# Patient Record
Sex: Female | Born: 1965 | Race: White | Hispanic: No | Smoking: Never smoker
Health system: Southern US, Community
[De-identification: ages and names within clinical notes are randomized; demographics above are authoritative.]

## PROBLEM LIST (undated history)

## (undated) DIAGNOSIS — R7303 Prediabetes: Secondary | ICD-10-CM

## (undated) DIAGNOSIS — R600 Localized edema: Secondary | ICD-10-CM

## (undated) DIAGNOSIS — C801 Malignant (primary) neoplasm, unspecified: Secondary | ICD-10-CM

## (undated) DIAGNOSIS — G8929 Other chronic pain: Secondary | ICD-10-CM

## (undated) DIAGNOSIS — R002 Palpitations: Secondary | ICD-10-CM

## (undated) DIAGNOSIS — F32A Depression, unspecified: Secondary | ICD-10-CM

## (undated) DIAGNOSIS — F419 Anxiety disorder, unspecified: Secondary | ICD-10-CM

## (undated) DIAGNOSIS — E282 Polycystic ovarian syndrome: Secondary | ICD-10-CM

## (undated) DIAGNOSIS — R0602 Shortness of breath: Secondary | ICD-10-CM

## (undated) DIAGNOSIS — I4891 Unspecified atrial fibrillation: Secondary | ICD-10-CM

## (undated) DIAGNOSIS — I10 Essential (primary) hypertension: Secondary | ICD-10-CM

## (undated) DIAGNOSIS — R519 Headache, unspecified: Secondary | ICD-10-CM

## (undated) DIAGNOSIS — F329 Major depressive disorder, single episode, unspecified: Secondary | ICD-10-CM

## (undated) DIAGNOSIS — R51 Headache: Secondary | ICD-10-CM

## (undated) HISTORY — DX: Polycystic ovarian syndrome: E28.2

## (undated) HISTORY — DX: Malignant (primary) neoplasm, unspecified: C80.1

## (undated) HISTORY — DX: Localized edema: R60.0

## (undated) HISTORY — DX: Prediabetes: R73.03

## (undated) HISTORY — DX: Depression, unspecified: F32.A

## (undated) HISTORY — PX: OTHER SURGICAL HISTORY: SHX169

## (undated) HISTORY — DX: Major depressive disorder, single episode, unspecified: F32.9

## (undated) HISTORY — DX: Shortness of breath: R06.02

## (undated) HISTORY — DX: Anxiety disorder, unspecified: F41.9

## (undated) HISTORY — PX: SKIN CANCER EXCISION: SHX779

## (undated) HISTORY — PX: WISDOM TOOTH EXTRACTION: SHX21

---
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
## 1999-08-10 ENCOUNTER — Other Ambulatory Visit: Admission: RE | Admit: 1999-08-10 | Discharge: 1999-08-10 | Payer: Self-pay | Admitting: Obstetrics & Gynecology

## 2000-08-25 ENCOUNTER — Other Ambulatory Visit: Admission: RE | Admit: 2000-08-25 | Discharge: 2000-08-25 | Payer: Self-pay | Admitting: Obstetrics & Gynecology

## 2002-04-24 ENCOUNTER — Emergency Department (HOSPITAL_COMMUNITY): Admission: EM | Admit: 2002-04-24 | Discharge: 2002-04-24 | Payer: Self-pay

## 2002-09-06 ENCOUNTER — Encounter: Payer: Self-pay | Admitting: Emergency Medicine

## 2002-09-06 ENCOUNTER — Emergency Department (HOSPITAL_COMMUNITY): Admission: EM | Admit: 2002-09-06 | Discharge: 2002-09-06 | Payer: Self-pay | Admitting: Emergency Medicine

## 2002-09-10 ENCOUNTER — Ambulatory Visit (HOSPITAL_COMMUNITY): Admission: RE | Admit: 2002-09-10 | Discharge: 2002-09-10 | Payer: Self-pay | Admitting: Orthopedic Surgery

## 2002-09-10 ENCOUNTER — Encounter: Payer: Self-pay | Admitting: Orthopedic Surgery

## 2003-01-27 ENCOUNTER — Other Ambulatory Visit: Admission: RE | Admit: 2003-01-27 | Discharge: 2003-01-27 | Payer: Self-pay | Admitting: Obstetrics & Gynecology

## 2003-08-25 ENCOUNTER — Encounter (INDEPENDENT_AMBULATORY_CARE_PROVIDER_SITE_OTHER): Payer: Self-pay | Admitting: *Deleted

## 2003-08-25 ENCOUNTER — Inpatient Hospital Stay (HOSPITAL_COMMUNITY): Admission: RE | Admit: 2003-08-25 | Discharge: 2003-08-29 | Payer: Self-pay | Admitting: Obstetrics & Gynecology

## 2003-08-30 ENCOUNTER — Encounter: Admission: RE | Admit: 2003-08-30 | Discharge: 2003-09-29 | Payer: Self-pay | Admitting: Obstetrics & Gynecology

## 2003-09-26 ENCOUNTER — Other Ambulatory Visit: Admission: RE | Admit: 2003-09-26 | Discharge: 2003-09-26 | Payer: Self-pay | Admitting: Obstetrics & Gynecology

## 2003-10-04 ENCOUNTER — Ambulatory Visit (HOSPITAL_COMMUNITY): Admission: RE | Admit: 2003-10-04 | Discharge: 2003-10-04 | Payer: Self-pay | Admitting: Obstetrics & Gynecology

## 2004-09-27 ENCOUNTER — Encounter: Admission: RE | Admit: 2004-09-27 | Discharge: 2004-09-27 | Payer: Self-pay | Admitting: Obstetrics and Gynecology

## 2005-03-05 ENCOUNTER — Ambulatory Visit: Payer: Self-pay | Admitting: Internal Medicine

## 2006-02-25 ENCOUNTER — Ambulatory Visit: Payer: Self-pay | Admitting: Internal Medicine

## 2006-03-11 ENCOUNTER — Ambulatory Visit: Payer: Self-pay | Admitting: Internal Medicine

## 2007-05-19 ENCOUNTER — Encounter: Admission: RE | Admit: 2007-05-19 | Discharge: 2007-05-19 | Payer: Self-pay | Admitting: Obstetrics & Gynecology

## 2007-07-14 ENCOUNTER — Encounter (INDEPENDENT_AMBULATORY_CARE_PROVIDER_SITE_OTHER): Payer: Self-pay | Admitting: Obstetrics & Gynecology

## 2007-07-14 ENCOUNTER — Inpatient Hospital Stay (HOSPITAL_COMMUNITY): Admission: RE | Admit: 2007-07-14 | Discharge: 2007-07-17 | Payer: Self-pay | Admitting: Obstetrics & Gynecology

## 2007-07-20 ENCOUNTER — Encounter: Admission: RE | Admit: 2007-07-20 | Discharge: 2007-08-19 | Payer: Self-pay | Admitting: Obstetrics & Gynecology

## 2007-08-20 ENCOUNTER — Encounter: Admission: RE | Admit: 2007-08-20 | Discharge: 2007-09-18 | Payer: Self-pay | Admitting: Obstetrics & Gynecology

## 2007-09-19 ENCOUNTER — Encounter: Admission: RE | Admit: 2007-09-19 | Discharge: 2007-09-21 | Payer: Self-pay | Admitting: Obstetrics & Gynecology

## 2009-04-06 ENCOUNTER — Ambulatory Visit: Payer: Self-pay | Admitting: Internal Medicine

## 2009-04-06 LAB — CONVERTED CEMR LAB
Bilirubin Urine: NEGATIVE
Glucose, Urine, Semiquant: NEGATIVE
Ketones, urine, test strip: NEGATIVE
Nitrite: NEGATIVE
Protein, U semiquant: NEGATIVE
Specific Gravity, Urine: 1.015
Urobilinogen, UA: 0.2
pH: 6.5

## 2009-04-07 ENCOUNTER — Encounter: Payer: Self-pay | Admitting: Internal Medicine

## 2009-04-10 ENCOUNTER — Telehealth (INDEPENDENT_AMBULATORY_CARE_PROVIDER_SITE_OTHER): Payer: Self-pay | Admitting: *Deleted

## 2009-04-11 ENCOUNTER — Encounter (INDEPENDENT_AMBULATORY_CARE_PROVIDER_SITE_OTHER): Payer: Self-pay | Admitting: *Deleted

## 2009-04-11 ENCOUNTER — Telehealth (INDEPENDENT_AMBULATORY_CARE_PROVIDER_SITE_OTHER): Payer: Self-pay | Admitting: *Deleted

## 2009-05-22 ENCOUNTER — Ambulatory Visit: Payer: Self-pay | Admitting: Family Medicine

## 2009-05-22 LAB — CONVERTED CEMR LAB
Bilirubin Urine: NEGATIVE
Glucose, Urine, Semiquant: NEGATIVE
Ketones, urine, test strip: NEGATIVE
Protein, U semiquant: NEGATIVE
Specific Gravity, Urine: 1.01
Urobilinogen, UA: 0.2

## 2009-06-14 ENCOUNTER — Ambulatory Visit: Payer: Self-pay | Admitting: Internal Medicine

## 2009-06-17 LAB — CONVERTED CEMR LAB
Free T4: 0.9 ng/dL (ref 0.6–1.6)
TSH: 0.99 microintl units/mL (ref 0.35–5.50)

## 2009-06-19 ENCOUNTER — Encounter (INDEPENDENT_AMBULATORY_CARE_PROVIDER_SITE_OTHER): Payer: Self-pay | Admitting: *Deleted

## 2009-07-07 ENCOUNTER — Ambulatory Visit: Payer: Self-pay | Admitting: Family Medicine

## 2009-07-07 LAB — CONVERTED CEMR LAB
Glucose, Urine, Semiquant: NEGATIVE
Ketones, urine, test strip: NEGATIVE
Nitrite: NEGATIVE
WBC Urine, dipstick: NEGATIVE
pH: 6

## 2009-07-08 ENCOUNTER — Ambulatory Visit: Payer: Self-pay | Admitting: Family Medicine

## 2009-07-10 ENCOUNTER — Encounter: Payer: Self-pay | Admitting: Family Medicine

## 2009-07-11 LAB — CONVERTED CEMR LAB
Chlamydia, Swab/Urine, PCR: NEGATIVE
GC Probe Amp, Urine: NEGATIVE

## 2009-07-18 ENCOUNTER — Telehealth: Payer: Self-pay | Admitting: Family Medicine

## 2009-10-05 ENCOUNTER — Ambulatory Visit: Payer: Self-pay | Admitting: Internal Medicine

## 2009-11-07 ENCOUNTER — Telehealth (INDEPENDENT_AMBULATORY_CARE_PROVIDER_SITE_OTHER): Payer: Self-pay | Admitting: *Deleted

## 2009-11-07 ENCOUNTER — Emergency Department (HOSPITAL_COMMUNITY): Admission: EM | Admit: 2009-11-07 | Discharge: 2009-11-07 | Payer: Self-pay | Admitting: Emergency Medicine

## 2009-12-02 ENCOUNTER — Emergency Department (HOSPITAL_COMMUNITY): Admission: EM | Admit: 2009-12-02 | Discharge: 2009-12-02 | Payer: Self-pay | Admitting: Emergency Medicine

## 2010-02-15 ENCOUNTER — Encounter (INDEPENDENT_AMBULATORY_CARE_PROVIDER_SITE_OTHER): Payer: Self-pay | Admitting: *Deleted

## 2010-02-15 ENCOUNTER — Ambulatory Visit: Payer: Self-pay | Admitting: Internal Medicine

## 2010-07-30 ENCOUNTER — Ambulatory Visit: Payer: Self-pay | Admitting: Internal Medicine

## 2010-08-04 ENCOUNTER — Encounter: Payer: Self-pay | Admitting: Internal Medicine

## 2010-08-07 ENCOUNTER — Telehealth: Payer: Self-pay | Admitting: Internal Medicine

## 2010-08-15 LAB — CONVERTED CEMR LAB

## 2011-01-03 NOTE — Letter (Signed)
Summary: Work Dietitian at [employer]  [address] [place], [place] [address]   Phone: [phone]  Fax: [phone]    Today's Date: February 15, 2010  Name of Patient: Jodi Bailey  The above named patient had a medical visit today at:  10:10am / pm.  Please take this into consideration when reviewing the time away from work/school.    Special Instructions:  [  ] None  [X]  To be off the remainder of today, returning to the normal work / school schedule tomorrow.  [  ] To be off until the next scheduled appointment on ______________________.  [  ] Other ________________________________________________________________ ________________________________________________________________________   Sincerely yours,   Shonna Chock

## 2011-01-03 NOTE — Assessment & Plan Note (Signed)
Summary: STREP THROAT/KDC   Vital Signs:  Patient profile:   45 year old female Weight:      246.8 pounds Temp:     99.3 degrees F oral Pulse rate:   64 / minute Resp:     15 per minute BP sitting:   130 / 74  (left arm) Cuff size:   large  Vitals Entered By: Shonna Chock (February 15, 2010 10:38 AM) CC: Sore throat and eyes itchy/draining in the am. Patient would like to be checked for MRSA. Patient with 2 children at home with strep. 1 of patient's son had double ear infection,strep,MRSA, Pink eye. Comments REVIEWED MED LIST, PATIENT AGREED DOSE AND INSTRUCTION CORRECT    CC:  Sore throat and eyes itchy/draining in the am. Patient would like to be checked for MRSA. Patient with 2 children at home with strep. 1 of patient's son had double ear infection, strep, MRSA, and Pink eye.Marland Kitchen  History of Present Illness: Onset last night as "tearing"  ST with frontal headache & myalgias. Rx: Tylenol for temp to 101.5 this am. Son @ [facility]  ER with double otitis & facial MRSA. Flu shot 09/2009.  Allergies (verified): No Known Drug Allergies  Review of Systems General:  Complains of fever; denies chills and sweats. Eyes:  Complains of discharge and red eye; denies eye pain and vision loss-both eyes; Some yellow from eyes. ENT:  Denies earache, nasal congestion, and sinus pressure; No facial pain  or purulence. Resp:  Complains of cough; denies sputum productive.  Physical Exam  General:  well-nourished,in no acute distress; alert,appropriate and cooperative throughout examination Eyes:  No corneal or conjunctival inflammation noted. EOMI. Perrla.  Vision grossly normal. Ears:  External ear exam shows no significant lesions or deformities.  Otoscopic examination reveals clear canals, tympanic membranes are intact bilaterally without bulging, retraction, inflammation or discharge. Hearing is grossly normal bilaterally. Nose:  External nasal examination shows no deformity or inflammation. Nasal  mucosa are erythematous, esp R nare without lesions or exudates. Mouth:  Oral mucosa and oropharynx without lesions or exudates.  Teeth in good repair. Mild pharyngeal erythema with aphthous ulcer.   Lungs:  Normal respiratory effort, chest expands symmetrically. Lungs are clear to auscultation, no crackles or wheezes. Heart:  Normal rate and regular rhythm. S1 and S2 normal without gallop, murmur, click, rub or other extra sounds. Cervical Nodes:  No lymphadenopathy noted Axillary Nodes:  No palpable lymphadenopathy   Impression & Recommendations:  Problem # 1:  PHARYNGITIS-ACUTE (ICD-462) Aphthous ulcer present Her updated medication list for this problem includes:    Aspirin 81 Mg Tabs (Aspirin) .Marland Kitchen... 1 by mouth once daily    Doxycycline Hyclate 100 Mg Caps (Doxycycline hyclate) .Marland Kitchen... 1 two times a day ; avoid sun  Problem # 2:  CONJUNCTIVITIS (ICD-372.30) Puruence not present clinically  Problem # 3:  FEVER (ICD-780.60)  Complete Medication List: 1)  Aspirin 81 Mg Tabs (Aspirin) .Marland Kitchen.. 1 by mouth once daily 2)  Doxycycline Hyclate 100 Mg Caps (Doxycycline hyclate) .Marland Kitchen.. 1 two times a day ; avoid sun  Other Orders: Rapid Strep ([address])  Patient Instructions: 1)  Report signs of bacterial conjunctivitis as discussed; Natural Tears as needed. Zicam or Zinc lozenges for ST. 2)  Drink as much fluid as you can tolerate for the next few days. Bactroban to tender nasal tissue once daily  3)  Take 650-1000mg  of Tylenol every 4-6 hours as needed for relief of pain or comfort of fever AVOID taking more  than 4000mg   in a 24 hour period (can cause liver damage in higher doses) OR take 400-600mg  of Ibuprofen (Advil, Motrin) with food every 4-6 hours as needed for relief of pain or comfort of fever. Prescriptions: DOXYCYCLINE HYCLATE 100 MG CAPS (DOXYCYCLINE HYCLATE) 1 two times a day ; avoid sun  #20 x 0   Entered and Authorized by:   Marga Melnick MD   Signed by:   Marga Melnick MD on  02/15/2010   Method used:   Faxed to ...       [facility] [address] (retail)       [address]       [place], [place]  [address]       Ph: [phone] or [phone]       Fax: [phone]   RxID:   [id number]

## 2011-01-03 NOTE — Assessment & Plan Note (Signed)
Summary: POSSIBLE MRSA/RH......Marland Kitchen   Vital Signs:  Patient profile:   45 year old female Weight:      231.25 pounds Temp:     99.6 degrees F oral Pulse rate:   85 / minute Pulse rhythm:   regular BP sitting:   126 / 82  (left arm) Cuff size:   large  Vitals Entered By: Army Fossa CMA (July 30, 2010 4:31 PM) CC: Pt here for possible MRSA in vaginal area.    History of Present Illness: husband w/  documented MRSA (boils in the legs) dx last week she presents w/ a boil in the genital area x 3 days  she saw some d/c from the area, still hurting  ROS no fever also complaining of vaginal irritation and some vaginal discharge, pink color. yeast infection?  Allergies (verified): No Known Drug Allergies  Past History:  Past Medical History: denies major problems   Past Surgical History: c-section  Social History: Reviewed history from 05/22/2009 and no changes required. married  Physical Exam  General:  alert and well-developed.   Abdomen:  soft, non-tender, no distention, and no masses.   Genitalia:  she has a tender non-fluctuant .mass --0.5 cm-- distal and right from the clitoris vaginal introitus is a slightly red, some discharge Bimanual exam showed no mass    Impression & Recommendations:  Problem # 1:  CARBUNCLE AND FURUNCLE OF UNSPECIFIED SITE (ICD-680.9) Vulvar boil, likely MRSA given the location of the boil, doubt this is a Bartholin's cyst. Unfortunately there is nothing to be  cultured  at this point, unable to get any additional pus plan:  Doxycycline she would let me know immediately if worse, will send to gynecology (UPT: neg)  Problem # 2:  VAGINITIS (ICD-616.10)  wet prep sent Monistat over-the-counter addendum-- request diflucan, will RX but  still rec monistat The following medications were removed from the medication list:    Doxycycline Hyclate 100 Mg Caps (Doxycycline hyclate) .Marland Kitchen... 1 two times a day ; avoid sun Her updated  medication list for this problem includes:    Vibramycin 100 Mg Caps (Doxycycline hyclate) ..... One by mouth twice a day  Orders: Urine Pregnancy Test  ([address]) T- * Misc. Laboratory test [phone])  Complete Medication List: 1)  Vibramycin 100 Mg Caps (Doxycycline hyclate) .... One by mouth twice a day 2)  Diflucan 150 Mg Tabs (Fluconazole) .Marland Kitchen.. 1 by mouth once daily  Patient Instructions: 1)  take medications as prescribed 2)  Avoid excessive sun exposure 3)  Monistat over-the-counter for one week 4)  Call anytime if you are not improving Prescriptions: DIFLUCAN 150 MG TABS (FLUCONAZOLE) 1 by mouth once daily  #2 x 0   Entered and Authorized by:   Nolon Rod. Paz MD   Signed by:   Nolon Rod. Paz MD on 07/30/2010   Method used:   Electronically to        [facility] [phone][facility] (retail)       [address]       [place], [place]  [address]       Ph: [phone] or [phone]       Fax: [phone]   RxID:   [id number] VIBRAMYCIN 100 MG CAPS (DOXYCYCLINE HYCLATE) one by mouth twice a day  #14 x 0   Entered and Authorized by:   Nolon Rod. Paz MD   Signed by:   Nolon Rod. Paz MD on 07/30/2010   Method used:   Electronically to  [facility] [address] (retail)       [address]       [place], [place]  [address]       Ph: [phone] or [phone]       Fax: [phone]   RxID:   [id number]   Laboratory Results   Urine Tests      Urine HCG: negative

## 2011-01-03 NOTE — Progress Notes (Signed)
Summary: Pt status   ---- Converted from flag ---- ---- 07/31/2010 8:44 AM, Jose E. Paz MD wrote: please check on the patient, improving ? ------------------------------  Left message for pt to call back. Army Fossa CMA  August 07, 2010 1:09 PM  Left message for pt to call back. Army Fossa CMA  August 08, 2010 10:41 AM  I spoke with pt she states she is doing better. She has noticied a red spot on upper thigh but is thinking that it may just be a mosquito bite. She will come in if it gets worse. Army Fossa CMA  August 09, 2010 8:39 AM

## 2011-02-04 ENCOUNTER — Ambulatory Visit (INDEPENDENT_AMBULATORY_CARE_PROVIDER_SITE_OTHER): Payer: Self-pay | Admitting: Internal Medicine

## 2011-02-04 ENCOUNTER — Encounter: Payer: Self-pay | Admitting: Internal Medicine

## 2011-02-04 DIAGNOSIS — M549 Dorsalgia, unspecified: Secondary | ICD-10-CM | POA: Insufficient documentation

## 2011-02-12 NOTE — Assessment & Plan Note (Signed)
Summary: leaned over and back locked up, doesnt have back problems///sph   Vital Signs:  Patient profile:   45 year old female Weight:      240.13 pounds Pulse rate:   88 / minute Pulse rhythm:   regular BP sitting:   132 / 86  (left arm) Cuff size:   large  Vitals Entered By: Army Fossa CMA (February 04, 2011 3:44 PM) CC: Bending over yesterday and felt her back lock Comments haivng lower back pain using advil [facility]   History of Present Illness:  developed  severe back pain yesterday at 7 AM when she was bending to pick up something from the floor.  she  felt that her back " locked up"  , pain is at the bilateral paraspinal muscles around T12  to L2.  since then she's been using a  heating pad and Advil and the pain is slightly better  Review of systems No fevers No bladder or bowel incontinence No lower tourniquet paresthesias No radiation to the lower extremities She is allergic to codeine but she can tolerate hydrocodone if needed  Current Medications (verified): 1)  None  Allergies (verified): 1)  ! Codeine  Past History:  Past Medical History: Reviewed history from 07/30/2010 and no changes required. denies major problems   Past Surgical History: Reviewed history from 07/30/2010 and no changes required. c-section  Physical Exam  General:  alert and well-developed.   Msk:   antalgic gait, she is walking with her back slightly  flexed.  Slightly tender at  the paraspinal muscles at the midback, not tender over the spine Extremities:   no lower extremity edema Neurologic:  alert & oriented X3 and DTRs symmetrical ,  slightly decreased ankle jerk bilaterally. Strength symmetric,  straight leg test negative   Impression & Recommendations:  Problem # 1:  BACK PAIN (ICD-724.5) acute back pain, conservative treatment x few days  Her updated medication list for this problem includes:    Vicodin 5-500 Mg Tabs (Hydrocodone-acetaminophen) .Marland Kitchen... 1 or 2 by  mouth every 6 hours as needed pain    Flexeril 10 Mg Tabs (Cyclobenzaprine hcl) .Marland Kitchen... 1 by mouth three times a day as needed pain  Complete Medication List: 1)  Vicodin 5-500 Mg Tabs (Hydrocodone-acetaminophen) .Marland Kitchen.. 1 or 2 by mouth every 6 hours as needed pain 2)  Flexeril 10 Mg Tabs (Cyclobenzaprine hcl) .Marland Kitchen.. 1 by mouth three times a day as needed pain  Patient Instructions: 1)  rest, warm compresses 2)   ibuprofen 200 mg 3 tablets every 8 hours as needed for pain. Watch for stomach side effects ,  nausea, burning 3)   Flexeril as needed for muscle spasm , will make you drowsy 4)  Vicodin if pain continues, will make  you  drowsy 5)  Call if no better in 10 days Prescriptions: FLEXERIL 10 MG TABS (CYCLOBENZAPRINE HCL) 1 by mouth three times a day as needed pain  #21 x 0   Entered and Authorized by:   Nolon Rod. Ivyonna Hoelzel MD   Signed by:   Nolon Rod. Callahan Wild MD on 02/04/2011   Method used:   Print then Give to Patient   RxID:   [id number] VICODIN 5-500 MG TABS (HYDROCODONE-ACETAMINOPHEN) 1 or 2 by mouth every 6 hours as needed pain  #30 x 0   Entered and Authorized by:   Nolon Rod. Wendelin Bradt MD   Signed by:   Nolon Rod. Zakara Parkey MD on 02/04/2011   Method used:  Print then Give to Patient   RxID:   [id number]    Orders Added: 1)  Est. Patient Level III [[address]]

## 2011-02-18 ENCOUNTER — Encounter: Payer: Self-pay | Admitting: Internal Medicine

## 2011-02-18 ENCOUNTER — Ambulatory Visit (INDEPENDENT_AMBULATORY_CARE_PROVIDER_SITE_OTHER): Payer: BC Managed Care – PPO | Admitting: Internal Medicine

## 2011-02-18 ENCOUNTER — Other Ambulatory Visit: Payer: Self-pay | Admitting: Internal Medicine

## 2011-02-18 DIAGNOSIS — N390 Urinary tract infection, site not specified: Secondary | ICD-10-CM

## 2011-02-18 DIAGNOSIS — M545 Low back pain, unspecified: Secondary | ICD-10-CM | POA: Insufficient documentation

## 2011-02-18 DIAGNOSIS — Z85828 Personal history of other malignant neoplasm of skin: Secondary | ICD-10-CM | POA: Insufficient documentation

## 2011-02-18 DIAGNOSIS — R319 Hematuria, unspecified: Secondary | ICD-10-CM

## 2011-02-18 LAB — CONVERTED CEMR LAB
Bilirubin Urine: NEGATIVE
Glucose, Urine, Semiquant: NEGATIVE
Ketones, urine, test strip: NEGATIVE
Nitrite: NEGATIVE
Specific Gravity, Urine: <1.005

## 2011-02-20 ENCOUNTER — Other Ambulatory Visit: Payer: Self-pay | Admitting: Internal Medicine

## 2011-02-20 DIAGNOSIS — N390 Urinary tract infection, site not specified: Secondary | ICD-10-CM

## 2011-02-20 LAB — URINE CULTURE: Colony Count: >=100000

## 2011-02-20 MED ORDER — NITROFURANTOIN MONOHYD MACRO 100 MG PO CAPS: 100.0000 mg | ORAL_CAPSULE | Freq: Two times a day (BID) | ORAL | Status: DC

## 2011-02-21 ENCOUNTER — Telehealth: Payer: Self-pay

## 2011-02-21 ENCOUNTER — Other Ambulatory Visit: Payer: Self-pay | Admitting: Internal Medicine

## 2011-02-21 DIAGNOSIS — T887XXA Unspecified adverse effect of drug or medicament, initial encounter: Secondary | ICD-10-CM

## 2011-02-21 DIAGNOSIS — N390 Urinary tract infection, site not specified: Secondary | ICD-10-CM

## 2011-02-21 MED ORDER — FLUCONAZOLE 150 MG PO TABS: 150.0000 mg | ORAL_TABLET | Freq: Once | ORAL | Status: AC

## 2011-02-21 MED ORDER — NITROFURANTOIN MONOHYD MACRO 100 MG PO CAPS: 100.0000 mg | ORAL_CAPSULE | Freq: Two times a day (BID) | ORAL | Status: AC

## 2011-02-21 NOTE — Telephone Encounter (Signed)
Macrodantin should eradicate the infection and is a better initial choice than Cipro. Cipro should be reserved for more serious infections. The Macrodantin should not cause vaginitis. I did call in Diflucan 150 #1 just in case. Taking the probiotic Align should help prevent vaginitis or other antibiotic associated issues.

## 2011-02-21 NOTE — Telephone Encounter (Signed)
Spoke with patient and she indicated she is currently taking Cipro and will change to the recommended antibiotic. Patient states she usually gets a yeast infection with antibiotics and requested rx for Diflucan to be sent in as well # 2. Patient informed I will have to get that approved by MD and then I will [facility]. Patient aware if MD opposes prescribing Diflucan I will call to inform her

## 2011-02-28 NOTE — Assessment & Plan Note (Signed)
Summary: temp,blood in urine/cbs   Vital Signs:  Patient profile:   45 year old female LMP:     02/10/2011 Weight:      238.2 pounds Temp:     98.4 degrees F oral Pulse rate:   80 / minute Resp:     12 per minute BP sitting:   122 / 78  (left arm) Cuff size:   large  Vitals Entered By: Shonna Chock CMA (February 18, 2011 11:50 AM) CC: 1.) Temp since Saturday and blood in urine since last night   2.) Ongoing back pain , Dysuria LMP (date): 02/10/2011     Enter LMP: 02/10/2011   CC:  1.) Temp since Saturday and blood in urine since last night   2.) Ongoing back pain  and Dysuria.  History of Present Illness:    Onset 02/16/2011 as lightheadedness, nausea & dizziness. As of 03/18 she  reports burning with urination, urinary frequency, urgency, and hematuria, but denies vaginal discharge.  Associated symptoms include fever, shaking chills, R  flank pain, back pain ( onset 03/04 post strain; on NASIDS up to 6-8 /day since 03/04), and pelvic pain as RLQ/ inguinal area pain.  The patient denies the following risk factors: diabetes, history of GU anomaly, and history of pyelonephritis.  History is significant for > 3 UTIs in past year , all 6 episodes  treated @ [facility]. No C&S done. Her mother & sister have PMH of recurrent UTIs. PMH of PCOS.  Allergies: 1)  ! Codeine  Past History:  Past Medical History: Skin cancer, PMH  of, Melanoma R thigh age 52  PCOS, PMH of  Past Surgical History: G 3 P 3 ; 3 C-section; Melanoma resected R thigh age 52  Review of Systems Neuro:  Denies brief paralysis, disturbances in coordination, falling down, numbness, poor balance, tingling, tremors, and weakness.  Physical Exam  General:  well-nourished,in no acute distress; alert,appropriate and cooperative throughout examination Lungs:  Normal respiratory effort, chest expands symmetrically. Lungs are clear to auscultation, no crackles or wheezes. Heart:  Normal rate and regular rhythm. S1 and S2  normal without gallop, murmur, click, rub or other extra sounds. Abdomen:  Bowel sounds positive,abdomen soft and non-tender without masses, organomegaly or hernias noted but tender R distal inguinal crease Msk:  She lay down & sat up w/o help; neg SLR but discomfort R low back. Slight R flank tenderness to percussion Neurologic:  alert & oriented X3, strength normal in all extremities, and DTRs symmetrical and normal.   Skin:  Intact without suspicious lesions or rashes; slightly damp Cervical Nodes:  No lymphadenopathy noted Axillary Nodes:  No palpable lymphadenopathy Psych:  memory intact for recent and remote, normally interactive, and good eye contact.     Impression & Recommendations:  Problem # 1:  UTI (ICD-599.0)  Chills, fever, flank pain;R/O Pyelonephritis. Recurrent UTIs in past year  Her updated medication list for this problem includes:    Ciprofloxacin Hcl 500 Mg Tabs (Ciprofloxacin hcl) .Marland Kitchen... 1 two times a day  Problem # 2:  LOW BACK PAIN SYNDROME (ICD-724.2)  Her updated medication list for this problem includes:    Vicodin 5-500 Mg Tabs (Hydrocodone-acetaminophen) .Marland Kitchen... 1 or 2 by mouth every 6 hours as needed pain    Flexeril 10 Mg Tabs (Cyclobenzaprine hcl) .Marland Kitchen... 1 by mouth three times a day as needed pain  Complete Medication List: 1)  Vicodin 5-500 Mg Tabs (Hydrocodone-acetaminophen) .Marland Kitchen.. 1 or 2 by mouth every 6 hours as  needed pain 2)  Flexeril 10 Mg Tabs (Cyclobenzaprine hcl) .Marland Kitchen.. 1 by mouth three times a day as needed pain 3)  Ciprofloxacin Hcl 500 Mg Tabs (Ciprofloxacin hcl) .Marland Kitchen.. 1 two times a day 4)  Phenazo 200 Mg Tabs (Phenazopyridine hcl) .Marland Kitchen.. 1 every 6-8 hrs as needed burning  Other Orders: UA Dipstick w/o Micro (manual) ([address]) Specimen Handling (99000) T-Culture, Urine ([id number])  Patient Instructions: 1)  Urology referral if UTIs are recurrent.Drink clear liquids only for the next 24 hours, then slowly add other liquids and food as you   tolerate them. Consider Physical Therapy or Chiropractry if back does not improve. Prescriptions: PHENAZO 200 MG TABS (PHENAZOPYRIDINE HCL) 1 every 6-8 hrs as needed burning  #21 x 0   Entered and Authorized by:   Marga Melnick MD   Signed by:   Marga Melnick MD on 02/18/2011   Method used:   Electronically to        [facility] [phone][facility] (retail)       [address]       [place], [place]  [address]       Ph: [phone] or [phone]       Fax: [phone]   RxID:   [id number] CIPROFLOXACIN HCL 500 MG TABS (CIPROFLOXACIN HCL) 1 two times a day  #14 x 0   Entered and Authorized by:   Marga Melnick MD   Signed by:   Marga Melnick MD on 02/18/2011   Method used:   Electronically to        [facility] [phone][facility] (retail)       [address]       [place], [place]  [address]       Ph: [phone] or [phone]       Fax: [phone]   RxID:   [id number]    Orders Added: 1)  UA Dipstick w/o Micro (manual) [81002] 2)  Specimen Handling [[address]] 3)  T-Culture, Urine [[id number]] 4)  Est. Patient Level IV [626[address]]    Laboratory Results   Urine Tests    Routine Urinalysis   Color: lt. yellow Appearance: Clear Glucose: negative   (Normal Range: Negative) Bilirubin: negative   (Normal Range: Negative) Ketone: negative   (Normal Range: Negative) Spec. Gravity: <1.005   (Normal Range: 1.003-1.035) Blood: trace-lysed   (Normal Range: Negative) pH: 6.0   (Normal Range: 5.0-8.0) Protein: negative   (Normal Range: Negative) Urobilinogen: 0.2   (Normal Range: 0-1) Nitrite: negative   (Normal Range: Negative) Leukocyte Esterace: negative   (Normal Range: Negative)    Comments: sent for culture

## 2011-03-05 LAB — POCT RAPID STREP A (OFFICE): Streptococcus, Group A Screen (Direct): POSITIVE — AB

## 2011-04-16 NOTE — Op Note (Signed)
NAME:  Jodi Bailey, RISH NO.:  [id number]   MEDICAL RECORD NO.:  [id number]          PATIENT TYPE:  INP   LOCATION:  9132                          FACILITY:  [facility]   PHYSICIAN:  Genia Del, M.D.DATE OF BIRTH:  Jan 26, 1966   DATE OF PROCEDURE:  07/14/2007  DATE OF DISCHARGE:                               OPERATIVE REPORT   PREOPERATIVE DIAGNOSIS:  38+ weeks gestation, history of C-section,  gestational diabetes mellitus A2.   POSTOPERATIVE DIAGNOSIS:  38+ weeks gestation, history of C-section,  gestational diabetes mellitus A2.   INTERVENTION:  Repeat elective C-section.   SURGEON:  Dr. Genia Del, no assistant.   PROCEDURE:  Under spinal anesthesia the patient is in 15 degrees left  decubitus position.  She is prepped with Betadine on the abdominal,  suprapubic, vulvar and vaginal areas. The patient is draped as usual and  the bladder catheter is inserted.  We verified the level of anesthesia  which is adequate.  We then infiltrated the subcutaneous tissue with  Marcaine one-quarter plain 20 mL at the site of the previous C-section.  We make a Pfannenstiel incision with a scalpel at the site of the  previous scar.  We opened the adipose tissue with the electrocautery.  We opened the aponeurosis transversely with the electrocautery and  complete the incision on each side with Mayo scissors.  We then separate  the recti muscles from the aponeurosis in the midline superiorly and  inferiorly.  We opened the parietal peritoneum longitudinally with [facility]  scissors.  We then opened the visceral peritoneum over the lower uterine  segment transversely with Metz scissors and reclined the bladder  downward.  We make a low transverse hysterotomy with a scalpel.  Extension is done on each side with dressing scissors.  The fetus is in  cephalic presentation.  The amniotic fluid is clear.  Birth of a baby  boy at 54, the cord is clamped and cut. The baby was  suctioned and  given to the neonatal team.  Apgars are nine and nine, the weight is 9  pounds.  We then evacuate the placenta spontaneously.  The placenta is  sent to pathology.  Cord blood banking is done.  We make a uterine  revision. The uterus contracts well with Pitocin IV. Ancef 1 gram IV is  given after cord clamping.  We verify both ovaries and both tubes which  are normal to inspection.  We closed the hysterotomy with a first a  running locked suture of Vicryl 0. A second layer in a mattress fashion  is done with Vicryl 0 which complete hemostasis.  We then irrigate and  suction the abdominopelvic cavity.  We reapproximate the recti muscles  on the midline with separate stitches of Vicryl 0.  We then closed the  aponeurosis in two half running sutures of Vicryl 0. The hemostasis is  completed on the adipose tissue with the electrocautery.  We then  reapproximate the skin with staples.  The count of instruments and  sponges was complete x2.  A dry compressive dressing is applied on the  incision.  The estimated  blood loss was a 800 mL.  No complications  occurred and the patient was brought to recovery room in good stable  status.      Genia Del, M.D.  Electronically Signed     ML/MEDQ  D:  07/14/2007  T:  07/14/2007  Job:  [id number]

## 2011-04-16 NOTE — Discharge Summary (Signed)
NAME:  Jodi Bailey, BAI NO.:  [id number]   MEDICAL RECORD NO.:  [id number]          PATIENT TYPE:  INP   LOCATION:  9132                          [facility]ILITY[facility]   PHYSICIAN:  Genia Del, M.D.DATE OF BIRTH:  06-25-1966   DATE OF ADMISSION:  07/14/2007  DATE OF DISCHARGE:  07/17/2007                               DISCHARGE SUMMARY   ADMISSION DIAGNOSES:  1. Thirty-eight-plus weeks' gestation with history of cesarean      section.  2. Gestational diabetes, A1.  3. Chronic hypertension.   DISCHARGE DIAGNOSES:  1. Thirty-eight-plus weeks' gestation with history of cesarean      section.  2. Gestational diabetes, A1.  3. Chronic hypertension.   HOSPITAL COURSE:  The patient had her C-section on July 10, 2007, had a  baby boy, 9 pounds, Apgars 9 and 9.  Estimated blood loss was 800 mL.  Postop evaluation was unremarkable.  She remained hemodynamically  stable.  Her blood pressures were stable at discharge in the 120s over  70s and 80s.  She remained afebrile.  She was discharged in good stable  status on postop day #3.  Her postop hemoglobin was 10.6.  Postop advice  was given.   DISCHARGE MEDICATIONS:  She was discharged with prescription of Ambien  p.r.n. and staple removal will be done at [facility] office.      Genia Del, M.D.  Electronically Signed     ML/MEDQ  D:  09/10/2007  T:  09/11/2007  Job:  [id number]

## 2011-04-19 NOTE — Op Note (Signed)
NAME:  Jodi Bailey, Jodi Bailey NO.:  [id number]   MEDICAL RECORD NO.:  [id number]                   PATIENT TYPE:  EMS   LOCATION:  [facility]JO                                 FACILITY:  [facility]   PHYSICIAN:  Dionne Ano. Everlene Other, M.D.         DATE OF BIRTH:  11-10-1966   DATE OF PROCEDURE:  09/06/2002  DATE OF DISCHARGE:                                 OPERATIVE REPORT   HISTORY OF PRESENT ILLNESS:  I was told to see the patient in the [facility]  Emergency Room upon the consultation request from Dr. Lonia Skinner.  This  patient is 47 years young.  She fell onto her left elbow today, which is her  nondominant extremity sustaining a dislocation, posterolateral in nature.  The patient was reaching up and standing in a lawn chair when she lost her  footing and fell on the outstretched left upper extremity and sustained the  dislocation.  Since that time, she has had decreased sensation in her small  and ring fingers, pain, and obvious deformity.  She was transported to [facility] Emergency Room by the ambulance and I was asked to see her.   PAST MEDICAL HISTORY:  Atrial fibrillation and a heart murmur.   PAST SURGICAL HISTORY:  Cesarean section.   CURRENT MEDICATIONS:  Vitamins.   DRUG ALLERGIES:  None.   SOCIAL HISTORY:  She is a nonsmoker.  Her son, I know well, as I have  treated his elbow fracture in the past.   PHYSICAL EXAMINATION:   GENERAL:  Revealed a very pleasant female, alert and oriented in obvious  distress secondary to a dislocation.   EXTREMITIES:  The right upper extremity was neurovascularly intact and  atraumatic.  The bilateral lower extremities were atraumatic and  neurovascularly intact.  The left upper extremity has a dislocation  primarily laterally in its clinical appearance.  The patient has no signs of  compartment syndrome.  Radial and median nerves are intact.  Ulnar nerve was  noted to be contused given the direction of the  dislocation and the fact  that she has expanded 2-point discrimination in the ulnar distribution, as  well as decreased motor function.  Shows no signs of shoulder or wrist  abnormalities.   CHEST:  Clear.   ABDOMEN:  Nontender, nondistended.   HEENT:  Within normal limits.   DESCRIPTION OF PROCEDURE:  I have given her appropriate sedation in the form  of morphine and valium.  I then took x-rays revealing the lateral  dislocation.  The patient tolerated this well.  Following this, under  morphine and valium IV conscious sedation with oxygen being delivered, the  patient underwent a smooth reduction performed by myself.  This was  manipulative reduction under conscious sedation.  She tolerated this well  and there were no complications.  Following this, post-reduction x-rays  showed excellent position in the A/P and lateral planes without radial head  fracture.  The patient tolerated this well, she had excellent pulse, radial  and median nerves were working nicely.  The ulnar nerve was without change  from her pre-reduction examination.  She tolerated this well.  Following  this, I checked compartments.  They were all soft.  I then placed her in a  posterior long-arm splint with stirrups and at this point in time, I checked  her pulse once again.  The patient tolerated this well.  She was vascularly  intact without signs of compartment syndrome.  Splint was placed nicely and  she had excellent radial and median nerve function.  I then placed her on  precautions, including elevation, ice, monitoring neurovascular status, and  she will return to see me in 48 hours at which time, we will order an MRI.  I would like to assess her nerve at that time and make sure she is coming  along accordingly.  I have gone over with her do's and don't's, etc.  I am  going to have her to begin Percocet 1-2 q.4-6h. p.r.n. pain p.o. and Robaxin  to be taken as directed 500 mg 1 p.o. q.6-8h. p.r.n. spasm.   We have gone  over all details at length.  If there are any neurovascular problems,  questions or concerns, etc. she will notify me.  It has been a pleasure to  see her today.  Her reduction went without difficulty and she was well-  reduced on post-reduction x-rays.  If there are any problems, questions or  concerns she will certainly notify me.  I have discussed all issues at  length with she and her husband.                                                Dionne Ano. Everlene Other, M.D.    Nash Mantis  D:  09/06/2002  T:  09/07/2002  Job:  [id number]   cc:   Lonia Skinner, M.D.

## 2011-04-19 NOTE — Discharge Summary (Signed)
   NAME:  Jodi Bailey, Jodi Bailey                      ACCOUNT NO.:  [id number]   MEDICAL RECORD NO.:  [id number]                   PATIENT TYPE:  INP   LOCATION:  9107                                 FACILITY:  [facility]   PHYSICIAN:  Genia Del, M.D.             DATE OF BIRTH:  05/04/1966   DATE OF ADMISSION:  08/25/2003  DATE OF DISCHARGE:  08/29/2003                                 DISCHARGE SUMMARY   ADMISSION DIAGNOSES:  1. Thirty-seven plus weeks.  2. Pregnancy-induced hypertension.  3. Previous cesarean section.   DISCHARGE DIAGNOSES:  1. Thirty-seven plus weeks.  2. Pregnancy-induced hypertension.  3. Previous cesarean section.  4. Birth of a baby boy on August 25, 2003.   INTERVENTION:  Repeat elective low transverse cesarean section.   HOSPITAL COURSE:  Mrs. Vanpatten is a 45 year old,  G2, P1, at 74 plus weeks  gestation with pregnancy-induced hypertension.  She had a previous cesarean  section for failure to progress after being induced for preeclampsia.  She  had a baby boy by cesarean section at 40 weeks weighing 9 pounds and 8  ounces, so an elective repeat cesarean section was done on August 25, 2003.  It was a low transverse cesarean section.  She had a baby boy with  Apgars of 8/9, weighing 8 pounds and 6 ounces.  The hysterotomy was closed  in two planes.  The ovaries and tubes were normal.  The estimated blood loss  was 800 cc.  No complication occurred.   The postoperative course was marked by the development of mild preeclampsia.  Her diastolic blood pressures were in the 80 and 90 range, and she had mild  elevation of her liver enzymes, ALT, and AST.  Her platelets remained  stable.  Uric acid went up to 8.1.  She did not require magnesium sulfate,  and she was discharged in stable status on postoperative day #4, August 29, 2003.  Preeclampsia warnings were given.   FOLLOW UP:  The patient was to return to the office the next day for a blood  pressure check and repeat PIH labs.   DISCHARGE MEDICATIONS:  1. Motrin p.r.n. for pain.  2. Darvocet p.r.n. for pain.   DISCHARGE INSTRUCTIONS:  Postpartum advice and preeclampsia warnings were  given.                                               Genia Del, M.D.    ML/MEDQ  D:  09/25/2003  T:  09/25/2003  Job:  [id number]

## 2011-04-19 NOTE — Op Note (Signed)
NAME:  Jodi Bailey, Jodi Bailey                      ACCOUNT NO.:  [id number]   MEDICAL RECORD NO.:  [id number]                   PATIENT TYPE:  INP   LOCATION:  9107                                 FACILITY:  [facility]   PHYSICIAN:  Genia Del, M.D.             DATE OF BIRTH:  Dec 26, 1965   DATE OF PROCEDURE:  08/25/2003  DATE OF DISCHARGE:                                 OPERATIVE REPORT   PREOPERATIVE DIAGNOSES:  1. A 37+ week gestation with mild pregnancy-induced hypertension.  2. Previous cesarean section.   POSTOPERATIVE DIAGNOSES:  1. A 37+ week gestation with mild pregnancy-induced hypertension.  2. Previous cesarean section.   INTERVENTION:  Repeat low transverse cesarean section.   SURGEON:  Genia Del, M.D.   ASSISTANT:  Lenoard Aden, M.D.   ANESTHESIOLOGIST:  Octaviano Glow. Pamalee Leyden, M.D.   DESCRIPTION OF PROCEDURE:  Under spinal anesthesia, the patient is in 15  left decubitus position.  She is prepped with Hibiclens on the abdominal,  suprapubic, vulvar, and vaginal areas.  The bladder catheter is put in place  and the patient is draped as usual.  After assuring good anesthesia level,  the skin is infiltrated at the level of the previous scar with Marcaine  0.25% plain 20 mL.  We then make a Pfannenstiel incision at the site of the  previous scar with a scalpel.  We open the aponeurosis transversely with the  electrocautery and the Mayo scissors.  We then open the parietal peritoneum  longitudinally.  We then put the bladder retractor in place.  The visceral  peritoneum is opened transversely with the Metzenbaum scissors over the  lower uterine segment.  The bladder is retracted downward.  We then open a  hysterotomy at the lower uterine segment transversely with the scalpel and  prolong on each side with the scissors.  The amniotic fluid is clear.  The  fetus is in cephalic presentation.  Birth at 21 of a baby boy.  The baby  is suctioned after delivery of  the head.  The cord is clamped and cut.  The  baby is given to the neonatal team.  Apgars are 8 and 9.  Weight is 8 pounds  6 ounces.  The placenta is evacuated spontaneously.  Pitocin is started in  the IV fluid, Ancef 1 g is given.  We then obtain a good uterine  contraction.  Uterine revision is done.  We close the hysterotomy with a  locked running suture of 0 Vicryl, a second plane is done in a mattress  fashion with 0 Vicryl, hemostasis is adequate.  Both ovaries are normal in  volume and appearance, both tubes are normal.  The uterus is well-  contracted.  We verify hemostasis at the aponeurosis and the recti muscles  as well as the bladder flap.  It is completed where necessary with the  electrocautery.  We then irrigate and suction the abdominopelvic cavity.  We  close the aponeurosis with two half-running sutures of 0 Vicryl.  The  hemostasis is completed as well at the  adipose tissue with the electrocautery.  We then reapproximate the skin with  staples.  A dry dressing is applied.  The count of sponges and instruments  was complete x2.  The estimated blood loss was 800 mL.  No complication  occurred, and the patient was transferred to recovery in good status.                                               Genia Del, M.D.    ML/MEDQ  D:  08/25/2003  T:  08/26/2003  Job:  [id number]

## 2011-06-06 ENCOUNTER — Encounter: Payer: Self-pay | Admitting: Family Medicine

## 2011-06-06 ENCOUNTER — Ambulatory Visit (INDEPENDENT_AMBULATORY_CARE_PROVIDER_SITE_OTHER): Payer: BC Managed Care – PPO | Admitting: Family Medicine

## 2011-06-06 VITALS — BP 124/76 | HR 75 | Temp 98.5°F | Wt 237.6 lb

## 2011-06-06 DIAGNOSIS — B373 Candidiasis of vulva and vagina: Secondary | ICD-10-CM

## 2011-06-06 DIAGNOSIS — N390 Urinary tract infection, site not specified: Secondary | ICD-10-CM

## 2011-06-06 LAB — POCT URINALYSIS DIPSTICK
Bilirubin, UA: NEGATIVE
Glucose, UA: NEGATIVE
Ketones, UA: NEGATIVE
Nitrite, UA: NEGATIVE
Protein, UA: NEGATIVE
Spec Grav, UA: 1.01
Urobilinogen, UA: NEGATIVE
pH, UA: 7.5

## 2011-06-06 MED ORDER — CIPROFLOXACIN HCL 500 MG PO TABS: 500.0000 mg | ORAL_TABLET | Freq: Two times a day (BID) | ORAL | Status: AC

## 2011-06-06 MED ORDER — FLUCONAZOLE 150 MG PO TABS: 150.0000 mg | ORAL_TABLET | Freq: Once | ORAL | Status: AC

## 2011-06-06 NOTE — Patient Instructions (Signed)
Urinary Tract Infection (UTI)   Infections of the urinary tract can start in several places. A bladder infection (cystitis), a kidney infection (pyelonephritis), and a prostate infection (prostatitis) are different types of urinary tract infections. They usually get better if treated with medicines (antibiotics) that kill germs. Take all the medicine until it is gone. You or your child may feel better in a few days, but TAKE ALL MEDICINE or the infection may not respond and may become more difficult to treat.   HOME CARE INSTRUCTIONS   Drink enough water and fluids to keep the urine clear or pale yellow. Cranberry juice is especially recommended, in addition to large amounts of water.   Avoid caffeine, tea, and carbonated beverages. They tend to irritate the bladder.   Alcohol may irritate the prostate.   Only take over-the-counter or prescription medicines for pain, discomfort, or fever as directed by your caregiver.   FINDING OUT THE RESULTS OF YOUR TEST   Not all test results are available during your visit. If your or your child's test results are not back during the visit, make an appointment with your caregiver to find out the results. Do not assume everything is normal if you have not heard from your caregiver or the medical facility. It is important for you to follow up on all test results.   TO PREVENT FURTHER INFECTIONS:   Empty the bladder often. Avoid holding urine for long periods of time.   After a bowel movement, women should cleanse from front to back. Use each tissue only once.   Empty the bladder before and after sexual intercourse.   SEEK MEDICAL CARE IF:   There is back pain.   You or your child has an oral temperature above 100.4.   Your baby is older than 3 months with a rectal temperature of 100.5º F (38.1° C) or higher for more than 1 day.   Your or your child's problems (symptoms) are no better in 3 days. Return sooner if you or your child is getting worse.   SEEK IMMEDIATE MEDICAL CARE IF:    There is severe back pain or lower abdominal pain.   You or your child develops chills.   You or your child has an oral temperature above 100.4, not controlled by medicine.   Your baby is older than 3 months with a rectal temperature of 102º F (38.9º C) or higher.   Your baby is 3 months old or younger with a rectal temperature of 100.4º F (38º C) or higher.   There is nausea or vomiting.   There is continued burning or discomfort with urination.   MAKE SURE YOU:   Understand these instructions.   Will watch this condition.   Will get help right away if you or your child is not doing well or gets worse.   Document Released: 08/28/2005 Document Re-Released: 02/12/2010   [employer]® Patient Information ©2011 [employer].

## 2011-06-06 NOTE — Progress Notes (Signed)
  Subjective:    Jodi Bailey is a 45 y.o. female who complains of dysuria, right flank pain and frequency. She has had symptoms for 4 days. Patient also complains of back pain. Patient denies congestion, cough, fever, headache, rhinitis, sorethroat and stomach ache. Patient does have a history of recurrent UTI. Patient does not have a history of pyelonephritis.   The following portions of the patient's history were reviewed and updated as appropriate: allergies, current medications, past family history, past medical history, past social history, past surgical history and problem list.  Review of Systems Pertinent items are noted in HPI.    Objective:    BP 124/76  Pulse 75  Temp(Src) 98.5 F (36.9 C) (Oral)  Wt 237 lb 9.6 oz (107.775 kg)  SpO2 97% General appearance: alert, cooperative, appears stated age and no distress Back: negative, symmetric, no curvature. ROM normal. No CVA tenderness. Abdomen: soft, non-tender; bowel sounds normal; no masses,  no organomegaly   Laboratory:  Urine dipstick: trace for hemoglobin and large for leukocyte esterase.   Micro exam: not done.    Assessment:    Acute cystitis and UTI     Plan:    Medications: ciprofloxacin.

## 2011-06-10 ENCOUNTER — Telehealth: Payer: Self-pay

## 2011-06-10 MED ORDER — FLUCONAZOLE 150 MG PO TABS: ORAL_TABLET | ORAL | Status: DC

## 2011-06-10 NOTE — Telephone Encounter (Signed)
Message copied by Arnette Norris on Mon Jun 10, 2011  1:19 PM ------      Message from: Lelon Perla      Created: Sat Jun 08, 2011 10:01 AM       Neg UTI      + yeast infection---diflucan 150 mg #2  1 po x1,  May repeat in 3 days prn

## 2011-06-10 NOTE — Telephone Encounter (Signed)
Patient aware of results and requested Rx be sent to [facility] [phone]     KP

## 2011-09-16 LAB — COMPREHENSIVE METABOLIC PANEL
AST: 22
Albumin: 1.9 — ABNORMAL LOW
Albumin: 2 — ABNORMAL LOW
Alkaline Phosphatase: 104
BUN: 7
Calcium: 8.6
Creatinine, Ser: 0.65
Creatinine, Ser: 0.7
GFR calc Af Amer: >60
GFR calc non Af Amer: >60
Potassium: 4.1
Total Protein: 4.3 — ABNORMAL LOW

## 2011-09-16 LAB — BASIC METABOLIC PANEL
BUN: 10
Creatinine, Ser: 0.63
GFR calc non Af Amer: >60

## 2011-09-16 LAB — CBC
HCT: 31.1 — ABNORMAL LOW
MCHC: 33.6
MCV: 85.7
Platelets: 150
Platelets: 171
RDW: 14.6 — ABNORMAL HIGH
WBC: 8.9

## 2011-09-16 LAB — URIC ACID
Uric Acid, Serum: 5.8
Uric Acid, Serum: 5.9

## 2011-09-16 LAB — RPR: RPR Ser Ql: NONREACTIVE

## 2012-09-16 ENCOUNTER — Other Ambulatory Visit: Payer: Self-pay | Admitting: Obstetrics & Gynecology

## 2012-09-16 DIAGNOSIS — Z1231 Encounter for screening mammogram for malignant neoplasm of breast: Secondary | ICD-10-CM

## 2012-10-20 ENCOUNTER — Ambulatory Visit
Admission: RE | Admit: 2012-10-20 | Discharge: 2012-10-20 | Disposition: A | Discharge status: Home / Self Care | Payer: BC Managed Care – PPO | Source: Ambulatory Visit | Attending: Obstetrics & Gynecology | Admitting: Obstetrics & Gynecology

## 2012-10-20 ENCOUNTER — Ambulatory Visit: Payer: BC Managed Care – PPO

## 2012-10-20 DIAGNOSIS — Z1231 Encounter for screening mammogram for malignant neoplasm of breast: Secondary | ICD-10-CM

## 2012-12-10 ENCOUNTER — Ambulatory Visit (INDEPENDENT_AMBULATORY_CARE_PROVIDER_SITE_OTHER): Payer: BC Managed Care – PPO | Admitting: Internal Medicine

## 2012-12-10 ENCOUNTER — Encounter: Payer: Self-pay | Admitting: Internal Medicine

## 2012-12-10 VITALS — BP 124/88 | HR 84 | Temp 98.7°F | Wt 215.0 lb

## 2012-12-10 DIAGNOSIS — J209 Acute bronchitis, unspecified: Secondary | ICD-10-CM

## 2012-12-10 DIAGNOSIS — J069 Acute upper respiratory infection, unspecified: Secondary | ICD-10-CM

## 2012-12-10 MED ORDER — FLUCONAZOLE 150 MG PO TABS: ORAL_TABLET | ORAL | Status: DC

## 2012-12-10 MED ORDER — AMOXICILLIN-POT CLAVULANATE 875-125 MG PO TABS: 1.0000 | ORAL_TABLET | Freq: Two times a day (BID) | ORAL | Status: DC

## 2012-12-10 MED ORDER — BENZONATATE 200 MG PO CAPS: 200.0000 mg | ORAL_CAPSULE | Freq: Three times a day (TID) | ORAL | Status: DC | PRN

## 2012-12-10 NOTE — Progress Notes (Signed)
  Subjective:    Patient ID: Jodi Bailey, female    DOB: [birth year], 47 y.o.   MRN: [id number]  HPI The respiratory tract symptoms began 12/05/12 as head congestion .Productive cough with   green sputum as of 1/6.  Significant associated symptoms included frontal headache, facial pain, dental pain,&  scratchy throat   Fever , chills and sweats  present  last night Cough was associated with  shortness of breath but no wheezing .    Treatment with  NSAIDS  &  Tylenol was partially effective  There is no history of asthma. The patient had never smoked .                 Review of Systems Symptoms not present included earache and otic discharge Itchy , watery eyes & sneezing were not noted.     Objective:   Physical Exam General appearance:good health ;well nourished; no acute distress or increased work of breathing is present.  No  lymphadenopathy about the head, neck, or axilla noted.  Eyes: No conjunctival inflammation or lid edema is present. . Ears:  External ear exam shows no significant lesions or deformities.  Otoscopic examination reveals clear canals, tympanic membranes are intact bilaterally without bulging, retraction, inflammation or discharge. Nose:  External nasal examination shows no deformity or inflammation. Nasal mucosa are pink and moist without lesions or exudates. No septal dislocation or deviation.No obstruction to airflow.  Oral exam: Dental hygiene is good; lips and gums are healthy appearing.There is no oropharyngeal erythema or exudate noted.  Neck:  No deformities, thyromegaly, masses, or tenderness noted.   Supple with full range of motion without pain.  Heart:  Normal rate and regular rhythm. S1 and S2 normal without gallop, murmur, click, rub or other extra sounds.  Lungs:Chest clear to auscultation; no wheezes, rhonchi,rales ,or rubs present.No increased work of breathing.  Dry cough Extremities:  No cyanosis clubbing  noted . Trace  edema Skin: Warm & dry        Assessment & Plan:  #1 acute bronchitis w/o bronchospasm #2 URI Plan: See orders and recommendations

## 2012-12-10 NOTE — Patient Instructions (Addendum)
Plain Mucinex for thick secretions ;force NON dairy fluids . Use a Neti pot daily as needed for sinus congestion; going from open side to congested side . Nasal cleansing in the shower as discussed. Make sure that all residual soap is removed to prevent irritation.  

## 2013-06-01 LAB — HM PAP SMEAR

## 2013-06-14 ENCOUNTER — Ambulatory Visit (INDEPENDENT_AMBULATORY_CARE_PROVIDER_SITE_OTHER): Payer: BC Managed Care – PPO | Admitting: Internal Medicine

## 2013-06-14 ENCOUNTER — Encounter: Payer: Self-pay | Admitting: Internal Medicine

## 2013-06-14 VITALS — BP 130/84 | HR 98 | Wt 222.4 lb

## 2013-06-14 DIAGNOSIS — R0601 Orthopnea: Secondary | ICD-10-CM

## 2013-06-14 DIAGNOSIS — R002 Palpitations: Secondary | ICD-10-CM

## 2013-06-14 DIAGNOSIS — F329 Major depressive disorder, single episode, unspecified: Secondary | ICD-10-CM

## 2013-06-14 DIAGNOSIS — R0789 Other chest pain: Secondary | ICD-10-CM

## 2013-06-14 DIAGNOSIS — F32A Depression, unspecified: Secondary | ICD-10-CM

## 2013-06-14 MED ORDER — DULOXETINE HCL 30 MG PO CPEP: 30.0000 mg | ORAL_CAPSULE | Freq: Every day | ORAL | Status: DC

## 2013-06-14 NOTE — Progress Notes (Signed)
  Subjective:    Patient ID: Jodi Bailey, female    DOB: 1966-10-05, 47 y.o.   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 There are major emotional issues with her 60 year old son who has demonstrated suicidal gestures & acting out.  There is an obvious impact on her marriage.  Past medical history/family history/social history were all reviewed and updated. Pertinent data verified.      Review of Systems She has had intermittent palpitations & orthopnea w/o anginal equivalent.  She has had intermittent left thoracic chest tightness which is nonexertional and non-anginal in character.     Objective:   Physical Exam Gen.:  well-nourished in appearance. Alert, appropriate and cooperative throughout exam. Tearful discussing home situation Head: Normocephalic without obvious abnormalities  Eyes: No corneal or conjunctival inflammation noted. No lid lag , proptosis or nystagmus  Mouth: Oral mucosa and oropharynx reveal no lesions or exudates. Teeth in good repair. Neck: No deformities, masses, or tenderness noted.  Thyroid normal. Lungs: Normal respiratory effort; chest expands symmetrically. Lungs are clear to auscultation without rales, wheezes, or increased work of breathing. Heart: Normal rate and rhythm. Normal S1 and S2. No gallop, click, or rub. S5 murmur. Abdomen: Bowel sounds normal; abdomen soft and nontender. No masses, organomegaly or hernias noted. Striae                                 Musculoskeletal/extremities: No deformity or scoliosis noted of  the thoracic or lumbar spine.  No clubbing, cyanosis, edema, or significant extremity  deformity noted. Tone & strength  Normal. Joints normal . Nail health good. Able to lie down & sit up  w/o help.  Vascular: Carotid, radial artery, dorsalis pedis and  posterior tibial pulses are full and equal. No bruits present. Neurologic: Alert and oriented x3. Deep tendon reflexes symmetrical and normal.         Skin: Intact without suspicious lesions or rashes. Lymph: No cervical, axillary lymphadenopathy present. Psych: Mood and affect are appropriate to her history. Normally interactive ; communicative & open                                                                                       Assessment & Plan:  #1 depression #2 palpitations #3 orthopnea #4 atypical pain Plan: See orders and recommendations

## 2013-06-14 NOTE — Patient Instructions (Addendum)
To prevent palpitations or premature beats, avoid stimulants such as decongestants, diet pills, nicotine, or caffeine (coffee, tea, cola, or chocolate) to excess. Cardiovascular exercise, this can be as simple a program as walking, is recommended 30-45 minutes 3-4 times per week. If you're not exercising you should take 6-8 weeks to build up to this level.

## 2013-06-15 LAB — BASIC METABOLIC PANEL
GFR: 68.04 mL/min (ref >60.00)
Potassium: 4.6 mEq/L (ref 3.5–5.1)
Sodium: 139 mEq/L (ref 135–145)

## 2013-06-15 LAB — T4, FREE: Free T4: 1.01 ng/dL (ref 0.60–1.60)

## 2013-06-15 LAB — MAGNESIUM: Magnesium: 2.1 mg/dL (ref 1.5–2.5)

## 2013-06-15 LAB — TSH: TSH: 1.26 u[IU]/mL (ref 0.35–5.50)

## 2013-07-26 ENCOUNTER — Encounter: Payer: Self-pay | Admitting: Internal Medicine

## 2013-07-29 ENCOUNTER — Ambulatory Visit: Payer: BC Managed Care – PPO | Admitting: Family Medicine

## 2013-08-03 ENCOUNTER — Encounter: Payer: Self-pay | Admitting: Family Medicine

## 2013-08-03 ENCOUNTER — Ambulatory Visit (INDEPENDENT_AMBULATORY_CARE_PROVIDER_SITE_OTHER): Payer: BC Managed Care – PPO | Admitting: Family Medicine

## 2013-08-03 VITALS — BP 120/78 | HR 76 | Temp 98.6°F | Wt 229.4 lb

## 2013-08-03 DIAGNOSIS — F32A Depression, unspecified: Secondary | ICD-10-CM

## 2013-08-03 DIAGNOSIS — F329 Major depressive disorder, single episode, unspecified: Secondary | ICD-10-CM

## 2013-08-03 MED ORDER — CITALOPRAM HYDROBROMIDE 10 MG PO TABS: 10.0000 mg | ORAL_TABLET | Freq: Every day | ORAL | Status: DC

## 2013-08-03 NOTE — Progress Notes (Signed)
  Subjective:   Jodi Bailey is an 47 y.o. female who presents for evaluation and treatment of depressive symptoms.  Onset approximately several weeks ago, gradually worsening since that time.  Current symptoms include depressed mood, insomnia, fatigue, difficulty concentrating, loss of energy/fatigue, weight gain,.  Current treatment for depression:Individual therapy and Medication Sleep problems: Moderate   Early awakening:Absent   Energy: Poor Motivation: Poor Concentration: Poor Rumination/worrying: Moderate Memory: Good Tearfulness: Severe  Anxiety: Absent  Panic: Absent  Overall Mood: Moderately worse  Hopelessness: Moderate Suicidal ideation: Absent  Other/Psychosocial Stressors: son with bipolar disorder and other financial stress. Family history positive for depression in the patient's son(s).  Previous treatment modalities employed include Individual therapy and Medication.  Past episodes of depression:no Organic causes of depression present: None.  Review of Systems Pertinent items are noted in HPI.   Objective:   Mental Status Examination: Posture and motor behavior: Appropriate Dress, grooming, personal hygiene: Appropriate Facial expression: teary, sad Speech: Appropriate Mood: Negative Coherency and relevance of thought: Appropriate Thought content: Appropriate Perceptions: Appropriate Orientation:Appropriate Attention and concentration: Appropriate Memory: : Appropriate Information: Not examined Vocabulary: Appropriate Abstract reasoning: Appropriate Judgment: Appropriate    Assessment:   Experiencing the following symptoms of depression most of the day nearly every day for more than two consecutive weeks: depressed mood  Depressive Disorder:severe  Suicide Risk Assessment:  Suicidal intent: no Suicidal plan: no Access to means for suicide: no Lethality of means for suicide: no Prior suicide attempts: no Recent exposure to suicide:no      Plan:   Depression - Plan: citalopram (CELEXA) 10 MG tablet  Reviewed concept of depression as biochemical imbalance of neurotransmitters and rationale for treatment. Instructed patient to contact office or on-call physician promptly should condition worsen or any new symptoms appear and provided on-call telephone numbers.

## 2013-08-03 NOTE — Patient Instructions (Signed)

## 2013-08-31 ENCOUNTER — Encounter: Payer: Self-pay | Admitting: Family Medicine

## 2013-08-31 ENCOUNTER — Ambulatory Visit (INDEPENDENT_AMBULATORY_CARE_PROVIDER_SITE_OTHER): Payer: BC Managed Care – PPO | Admitting: Family Medicine

## 2013-08-31 VITALS — BP 136/76 | HR 73 | Temp 99.4°F | Wt 234.2 lb

## 2013-08-31 DIAGNOSIS — F3289 Other specified depressive episodes: Secondary | ICD-10-CM

## 2013-08-31 DIAGNOSIS — Z23 Encounter for immunization: Secondary | ICD-10-CM

## 2013-08-31 DIAGNOSIS — F329 Major depressive disorder, single episode, unspecified: Secondary | ICD-10-CM

## 2013-08-31 DIAGNOSIS — F32A Depression, unspecified: Secondary | ICD-10-CM

## 2013-08-31 MED ORDER — CITALOPRAM HYDROBROMIDE 20 MG PO TABS: 20.0000 mg | ORAL_TABLET | Freq: Every day | ORAL | Status: DC

## 2013-08-31 NOTE — Progress Notes (Signed)
  Subjective:    Patient ID: Jodi Bailey, female    DOB: [birth year], 47 y.o.   MRN: [id number]  HPI Pt here to discuss depression.  She is doing better but feels the dose should be increased.   Pt is not suicidal.     Review of Systems As above    Objective:   Physical Exam  BP 136/76  Pulse 73  Temp(Src) 99.4 F (37.4 C) (Oral)  Wt 234 lb 3.2 oz (106.232 kg)  SpO2 97% General appearance: alert, cooperative, appears stated age and no distress Lungs: clear to auscultation bilaterally Heart: S1, S2 normal Psych-- pt still teary but is better, not suicidal.       Assessment & Plan:

## 2013-08-31 NOTE — Assessment & Plan Note (Signed)
Increase celexa 20 mg qd rto 1 month or sooner prn

## 2013-08-31 NOTE — Patient Instructions (Addendum)

## 2013-11-05 ENCOUNTER — Telehealth: Payer: Self-pay | Admitting: Internal Medicine

## 2013-11-05 NOTE — Telephone Encounter (Signed)
Patient called requesting to switch her care from Dr. Alwyn Ren to Dr. Laury Axon. She has gone to Dr. Laury Axon for her last 2 visits regarding her depression and feels that Dr. Laury Axon is now her "regular doctor." Please advise if okay.

## 2013-11-05 NOTE — Telephone Encounter (Signed)
OK 

## 2013-11-06 NOTE — Telephone Encounter (Signed)
ok 

## 2013-11-09 ENCOUNTER — Ambulatory Visit (INDEPENDENT_AMBULATORY_CARE_PROVIDER_SITE_OTHER): Payer: BC Managed Care – PPO | Admitting: Licensed Clinical Social Worker

## 2013-11-09 DIAGNOSIS — F321 Major depressive disorder, single episode, moderate: Secondary | ICD-10-CM

## 2013-11-11 ENCOUNTER — Encounter: Payer: Self-pay | Admitting: Family Medicine

## 2013-11-11 ENCOUNTER — Ambulatory Visit (INDEPENDENT_AMBULATORY_CARE_PROVIDER_SITE_OTHER): Payer: BC Managed Care – PPO | Admitting: Family Medicine

## 2013-11-11 VITALS — BP 132/76 | HR 80 | Temp 98.8°F | Ht 67.0 in | Wt 242.2 lb

## 2013-11-11 DIAGNOSIS — F329 Major depressive disorder, single episode, unspecified: Secondary | ICD-10-CM

## 2013-11-11 DIAGNOSIS — F32A Depression, unspecified: Secondary | ICD-10-CM

## 2013-11-11 MED ORDER — BUPROPION HCL ER (XL) 150 MG PO TB24: ORAL_TABLET | ORAL | Status: DC

## 2013-11-11 MED ORDER — CITALOPRAM HYDROBROMIDE 20 MG PO TABS: ORAL_TABLET | ORAL | Status: DC

## 2013-11-11 NOTE — Patient Instructions (Signed)
RTO 1 month f/u depression Add Wellbutrin --- in a couple of weeks you can increase celexa to 1 1/2 if needed

## 2013-11-11 NOTE — Assessment & Plan Note (Signed)
Add wellbutrin xl 150 mg 1 po qd for 1 week then inc to 2  Day con't celexa ---can inc to 1 1/2 tab in 1-2 weeks prn  rto 1 month

## 2013-11-11 NOTE — Progress Notes (Signed)
Pre visit review using our clinic review tool, if applicable. No additional management support is needed unless otherwise documented below in the visit note. 

## 2013-11-11 NOTE — Progress Notes (Signed)
   Subjective:    Patient ID: Jodi Bailey, female    DOB: 1966-08-08, 47 y.o.   MRN: [id number]  HPI Pt here f/u depression.  She states she like the effects of celexa but feels she needs more. She is also requesting a flu shot. Berniece Andreas spoke with me earlier this week and felt the celexa was not doing enough and the pt would benefit from Wellbutrin.    Review of Systems As above    Objective:   Physical Exam BP 132/76  Pulse 80  Temp(Src) 98.8 F (37.1 C) (Oral)  Ht 5\' 7"  (1.702 m)  Wt 242 lb 3.2 oz (109.861 kg)  BMI 37.92 kg/m2  SpO2 96%  LMP 09/28/2013 General appearance: alert, cooperative, appears stated age and no distress Psych-- not suicidal, low energy        Assessment & Plan:

## 2013-11-15 ENCOUNTER — Ambulatory Visit: Payer: BC Managed Care – PPO | Admitting: Internal Medicine

## 2013-11-16 ENCOUNTER — Ambulatory Visit (INDEPENDENT_AMBULATORY_CARE_PROVIDER_SITE_OTHER): Payer: BC Managed Care – PPO | Admitting: Licensed Clinical Social Worker

## 2013-11-16 DIAGNOSIS — F321 Major depressive disorder, single episode, moderate: Secondary | ICD-10-CM

## 2014-03-16 ENCOUNTER — Ambulatory Visit (INDEPENDENT_AMBULATORY_CARE_PROVIDER_SITE_OTHER): Payer: BC Managed Care – PPO | Admitting: Family Medicine

## 2014-03-16 ENCOUNTER — Encounter: Payer: Self-pay | Admitting: Family Medicine

## 2014-03-16 VITALS — BP 130/70 | HR 78 | Temp 98.7°F | Wt 253.0 lb

## 2014-03-16 DIAGNOSIS — F32A Depression, unspecified: Secondary | ICD-10-CM

## 2014-03-16 DIAGNOSIS — F329 Major depressive disorder, single episode, unspecified: Secondary | ICD-10-CM

## 2014-03-16 DIAGNOSIS — F3289 Other specified depressive episodes: Secondary | ICD-10-CM

## 2014-03-16 DIAGNOSIS — E669 Obesity, unspecified: Secondary | ICD-10-CM | POA: Insufficient documentation

## 2014-03-16 MED ORDER — CITALOPRAM HYDROBROMIDE 40 MG PO TABS: 40.0000 mg | ORAL_TABLET | Freq: Every day | ORAL | Status: DC

## 2014-03-16 MED ORDER — CITALOPRAM HYDROBROMIDE 20 MG PO TABS: ORAL_TABLET | ORAL | Status: DC

## 2014-03-16 MED ORDER — BUPROPION HCL ER (XL) 150 MG PO TB24: ORAL_TABLET | ORAL | Status: DC

## 2014-03-16 NOTE — Patient Instructions (Signed)

## 2014-03-16 NOTE — Progress Notes (Signed)
Patient ID: Jodi Bailey, female   DOB: 1966-04-03, 48 y.o.   MRN: [id number] .  Subjective:    Patient ID: Jodi Bailey, female    DOB: 11-17-1966, 48 y.o.   MRN: [id number] HPI Pt is here f/u depression.  She was running out of wellbutrin so she started taking 1 and inc her celexa to 40 mg .  She is doing well.  No complaints.          Objective:    BP 130/70  Pulse 78  Temp(Src) 98.7 F (37.1 C) (Oral)  Wt 253 lb (114.76 kg)  SpO2 97% General appearance: alert, cooperative, appears stated age and no distress Neurologic: Alert and oriented X 3, normal strength and tone. Normal symmetric reflexes. Normal coordination and gait       Assessment & Plan:  1. Depression Stable--pt inc celexa to 40 on her own rto 6 months for cpe - buPROPion (WELLBUTRIN XL) 150 MG 24 hr tablet; Take 2 Tablets by mouth daily  Dispense: 60 tablet; Refill: 5 - citalopram (CELEXA) 40 MG tablet; Take 1 tablet (40 mg total) by mouth daily.  Dispense: 90 tablet; Refill: 3

## 2014-03-16 NOTE — Progress Notes (Signed)
Pre visit review using our clinic review tool, if applicable. No additional management support is needed unless otherwise documented below in the visit note. 

## 2014-07-11 ENCOUNTER — Emergency Department (HOSPITAL_COMMUNITY): Payer: BC Managed Care – PPO

## 2014-07-11 ENCOUNTER — Telehealth: Payer: Self-pay | Admitting: Family Medicine

## 2014-07-11 ENCOUNTER — Emergency Department (HOSPITAL_COMMUNITY)
Admission: EM | Admit: 2014-07-11 | Discharge: 2014-07-11 | Disposition: A | Discharge status: Home / Self Care | Payer: BC Managed Care – PPO | Attending: Emergency Medicine | Admitting: Emergency Medicine

## 2014-07-11 ENCOUNTER — Encounter (HOSPITAL_COMMUNITY): Payer: Self-pay | Admitting: Emergency Medicine

## 2014-07-11 DIAGNOSIS — Z791 Long term (current) use of non-steroidal anti-inflammatories (NSAID): Secondary | ICD-10-CM | POA: Insufficient documentation

## 2014-07-11 DIAGNOSIS — F3289 Other specified depressive episodes: Secondary | ICD-10-CM | POA: Insufficient documentation

## 2014-07-11 DIAGNOSIS — R42 Dizziness and giddiness: Secondary | ICD-10-CM | POA: Insufficient documentation

## 2014-07-11 DIAGNOSIS — F329 Major depressive disorder, single episode, unspecified: Secondary | ICD-10-CM | POA: Insufficient documentation

## 2014-07-11 DIAGNOSIS — F411 Generalized anxiety disorder: Secondary | ICD-10-CM | POA: Insufficient documentation

## 2014-07-11 DIAGNOSIS — Z79899 Other long term (current) drug therapy: Secondary | ICD-10-CM | POA: Insufficient documentation

## 2014-07-11 HISTORY — DX: Other chronic pain: G89.29

## 2014-07-11 HISTORY — DX: Headache: R51

## 2014-07-11 HISTORY — DX: Palpitations: R00.2

## 2014-07-11 HISTORY — DX: Headache, unspecified: R51.9

## 2014-07-11 LAB — D-DIMER, QUANTITATIVE: D-Dimer, Quant: 0.3 ug/mL-FEU (ref 0.00–0.48)

## 2014-07-11 LAB — CBC
HEMATOCRIT: 43.9 % (ref 36.0–46.0)
HEMOGLOBIN: 14.4 g/dL (ref 12.0–15.0)
MCH: 29.5 pg (ref 26.0–34.0)
MCHC: 32.8 g/dL (ref 30.0–36.0)
MCV: 90 fL (ref 78.0–100.0)
Platelets: 275 10*3/uL (ref 150–400)
RBC: 4.88 MIL/uL (ref 3.87–5.11)
RDW: 13.5 % (ref 11.5–15.5)
WBC: 9.9 10*3/uL (ref 4.0–10.5)

## 2014-07-11 LAB — BASIC METABOLIC PANEL
Anion gap: 14 (ref 5–15)
BUN: 16 mg/dL (ref 6–23)
CALCIUM: 8.9 mg/dL (ref 8.4–10.5)
CO2: 24 mEq/L (ref 19–32)
CREATININE: 0.96 mg/dL (ref 0.50–1.10)
Chloride: 103 mEq/L (ref 96–112)
GFR calc Af Amer: 80 mL/min — ABNORMAL LOW (ref >90)
GFR, EST NON AFRICAN AMERICAN: 69 mL/min — AB (ref >90)
GLUCOSE: 89 mg/dL (ref 70–99)
POTASSIUM: 4.2 meq/L (ref 3.7–5.3)
Sodium: 141 mEq/L (ref 137–147)

## 2014-07-11 LAB — I-STAT TROPONIN, ED: Troponin i, poc: 0 ng/mL (ref 0.00–0.08)

## 2014-07-11 MED ORDER — METOCLOPRAMIDE HCL 5 MG/ML IJ SOLN
10.0000 mg | Freq: Once | INTRAMUSCULAR | Status: AC
  Administered 2014-07-11: 10 mg via INTRAVENOUS
  Filled 2014-07-11: qty 2

## 2014-07-11 MED ORDER — SODIUM CHLORIDE 0.9 % IV BOLUS (SEPSIS)
1000.0000 mL | Freq: Once | INTRAVENOUS | Status: AC
  Administered 2014-07-11: 1000 mL via INTRAVENOUS

## 2014-07-11 MED ORDER — DIPHENHYDRAMINE HCL 50 MG/ML IJ SOLN
25.0000 mg | Freq: Once | INTRAMUSCULAR | Status: AC
  Administered 2014-07-11: 25 mg via INTRAVENOUS
  Filled 2014-07-11: qty 1

## 2014-07-11 NOTE — ED Notes (Addendum)
Pt c/o intermittent dizziness x 1 month that is worse over last 4 days with some falls; pt sts worse when changing positions; pt sts some HA

## 2014-07-11 NOTE — ED Provider Notes (Signed)
CSN: [id number]     Arrival date & time 07/11/14  60 History   First MD Initiated Contact with Patient 07/11/14 1100     Chief Complaint  Patient presents with  . Dizziness     (Consider location/radiation/quality/duration/timing/severity/associated sxs/prior Treatment) HPI  Jodi Bailey is a 48 y.o. female complaining of a typical headaches, described as frontal, 6-9/10 in intensity, associated with double and blurred vision, lightheaded sensation with presyncopal sensation and worsening in intensity and frequency of last 6 weeks. She's had 2 falls in the last several weeks, one when she was getting out of the shower, she states that she did not slip, she states that the headache and dizziness caused the fall. Patient denies sensation of vertigo, nausea, vomiting, exacerbation with head movement, exacerbation when going from sitting to standing, exacerbation with Valsalva, thunderclap onset, exacerbation in the morning, fever/chills. On review of system systems patient endorses a occasional palpitations and left midaxillary chest pain, nonpleuritic, non-positional, non-exertional. She also has severe bilateral intermittent ankle swelling which is now resolved. She has not done any elevation compression to help ease the edema. Patient denies cough, dyspnea on exertion, PND,   Past Medical History  Diagnosis Date  . Anxiety   . Depression   . Chronic headaches   . Palpitations    History reviewed. No pertinent past surgical history. Family History  Problem Relation Age of Onset  . Depression Sister     situational  . Depression Son   . Alcohol abuse Neg Hx   . Suicidality Son    History  Substance Use Topics  . Smoking status: Never Smoker   . Smokeless tobacco: Not on file  . Alcohol Use: Yes     Comment: RARELY   OB History   Grav Para Term Preterm Abortions TAB SAB Ect Mult Living                 Review of Systems  10 systems reviewed and found to be negative,  except as noted in the HPI.    Allergies  Codeine  Home Medications   Prior to Admission medications   Medication Sig Start Date End Date Taking? Authorizing Provider  BIOTIN PO Take 1 tablet by mouth daily.   Yes Historical Provider, MD  buPROPion (WELLBUTRIN XL) 150 MG 24 hr tablet Take 300 mg by mouth daily.   Yes Historical Provider, MD  citalopram (CELEXA) 40 MG tablet Take 40 mg by mouth daily.   Yes Historical Provider, MD  ibuprofen (ADVIL,MOTRIN) 200 MG tablet Take 400 mg by mouth every 8 (eight) hours as needed for headache or mild pain.   Yes Historical Provider, MD  Multiple Vitamins-Minerals (MULTIVITAMIN PO) Take 1 tablet by mouth daily.   Yes Historical Provider, MD  vitamin B-12 (CYANOCOBALAMIN) 1000 MCG tablet Take 1,000 mcg by mouth daily.   Yes Historical Provider, MD   BP 110/49  Pulse 80  Temp(Src) 98.9 F (37.2 C) (Oral)  Resp 15  SpO2 98%  LMP 07/01/2014 Physical Exam  Nursing note and vitals reviewed. Constitutional: She is oriented to person, place, and time. She appears well-developed and well-nourished. No distress.  HENT:  Head: Normocephalic and atraumatic.  Mouth/Throat: Oropharynx is clear and moist.  Eyes: Conjunctivae and EOM are normal. Pupils are equal, round, and reactive to light.  Neck: Normal range of motion. Neck supple.  Cardiovascular: Normal rate, regular rhythm and intact distal pulses.   Pulmonary/Chest: Effort normal and breath sounds normal. No stridor. No  respiratory distress. She has no wheezes. She has no rales. She exhibits no tenderness.  Abdominal: Soft. Bowel sounds are normal. She exhibits no distension and no mass. There is no tenderness. There is no rebound and no guarding.  Musculoskeletal: Normal range of motion. She exhibits no edema and no tenderness.  No calf asymmetry, superficial collaterals, palpable cords, edema, Homans sign negative bilaterally.    Neurological: She is alert and oriented to person, place, and  time.  II-Visual fields grossly intact. III/IV/VI-Extraocular movements intact.  Pupils reactive bilaterally. V/VII-Smile symmetric, equal eyebrow raise,  facial sensation intact VIII- Hearing grossly intact IX/X-Normal gag XI-bilateral shoulder shrug XII-midline tongue extension Motor: 5/5 bilaterally with normal tone and bulk Cerebellar: Normal finger-to-nose  and normal heel-to-shin test.   Romberg negative Ambulates with a coordinated gait   Psychiatric: She has a normal mood and affect.    ED Course  Procedures (including critical care time) Labs Review Labs Reviewed  BASIC METABOLIC PANEL - Abnormal; Notable for the following:    GFR calc non Af Amer 69 (*)    GFR calc Af Amer 80 (*)    All other components within normal limits  CBC  D-DIMER, QUANTITATIVE  I-STAT TROPOININ, ED    Imaging Review Dg Chest 2 View  07/11/2014   CLINICAL DATA:  Headache, dizziness  EXAM: CHEST  2 VIEW  COMPARISON:  None  FINDINGS: Upper normal heart size.  Normal mediastinal contours and pulmonary vascularity.  Bronchitic changes without infiltrate, pleural effusion, or pneumothorax.  No acute osseous findings.  IMPRESSION: Bronchitic changes.   Electronically Signed   By: Lavonia Dana M.D.   On: 07/11/2014 12:34   Ct Head Wo Contrast  07/11/2014   CLINICAL DATA:  Intermittent dizziness for 1 month worse over past 4 days, headache, some falling  EXAM: CT HEAD WITHOUT CONTRAST  TECHNIQUE: Contiguous axial images were obtained from the base of the skull through the vertex without intravenous contrast.  COMPARISON:  None  FINDINGS: Normal ventricular morphology.  No midline shift or mass effect.  Normal appearance of brain parenchyma.  No intracranial hemorrhage, mass lesion, or acute infarction.  Visualized paranasal sinuses and mastoid air cells clear.  Bones unremarkable.  IMPRESSION: Normal exam.   Electronically Signed   By: Lavonia Dana M.D.   On: 07/11/2014 12:44     EKG Interpretation None       MDM   Final diagnoses:  None    Filed Vitals:   07/11/14 1400 07/11/14 1430 07/11/14 1445 07/11/14 1500  BP: 139/71 108/55 105/57 110/49  Pulse: 80     Temp:      TempSrc:      Resp: 14 15 16 15   SpO2: 98%       Medications  sodium chloride 0.9 % bolus 1,000 mL (0 mLs Intravenous Stopped 07/11/14 1518)  metoCLOPramide (REGLAN) injection 10 mg (10 mg Intravenous Given 07/11/14 1349)  diphenhydrAMINE (BENADRYL) injection 25 mg (25 mg Intravenous Given 07/11/14 1349)    Jodi Bailey is a 48 y.o. female presenting with atypical headache, lightheaded sensation, 2 falls over the last week. Did not appear to be a peripheral vertigo. Head CT is negative, will obtain MRI.  Patient has pins placed in the right small digit when she broke her finger when she was 15 and 1982. I have discussed this with the MRI tech, they believe they should be MRI compatible. Headache cocktail given.  Review of systems patient endorses a left axillary chest pain.  Might of the presyncopal sensation d-dimer is ordered. Patient does not have any clinical signs consistent with a DVT.  D-dimer is found to be negative.  Patient is signed out to Dr. Ashok Cordia at shift change pending MRI.   Monico Blitz, PA-C 07/11/14 [phone]

## 2014-07-11 NOTE — Telephone Encounter (Signed)
Patient Information:  Caller Name: Lilibeth  Phone: [phone]  Patient: Jodi Bailey  Gender: Female  DOB: 1966-02-21  Age: 48 Years  PCP: Rosalita Chessman.  Pregnant: No  Office Follow Up:  Does the office need to follow up with this patient?: No  Instructions For The Office: N/A  RN Note:  Per disposition contacted the office and spoke with Caryl Pina and advised pt to go to [facility] ED for eval.  Symptoms  Reason For Call & Symptoms: Pt reports she is having episodes of dizziness with headache, ankle swelling.  Reviewed Health History In EMR: Yes  Reviewed Medications In EMR: Yes  Reviewed Allergies In EMR: Yes  Reviewed Surgeries / Procedures: Yes  Date of Onset of Symptoms: 07/07/2014 OB / GYN:  LMP: 07/01/2014  Guideline(s) Used:  Dizziness  Disposition Per Guideline:   Go to ED Now (or to Office with PCP Approval)  Reason For Disposition Reached:   Extra heart beats OR irregular heart beating (i.e., "palpitations")  Advice Given:  N/A  Patient Will Follow Care Advice:  YES

## 2014-07-11 NOTE — Telephone Encounter (Signed)
Per chart, pt is in the ER for evaluation.

## 2014-07-11 NOTE — Discharge Instructions (Signed)
Follow up with primary care doctor in the next couple days for recheck. Also given episodes lightheadedness/dizziness, palpitations, follow up with cardiologist - discuss possible outpatient holter monitor. Return to ER if worse, new symptoms, chest pain, trouble breathing, fevers, fainting episodes, other concern.

## 2014-07-11 NOTE — ED Notes (Signed)
AMBULATORY TO BR GAIT STEADY

## 2014-07-11 NOTE — ED Notes (Signed)
RETURNED FROM MRI.

## 2014-07-11 NOTE — ED Notes (Signed)
TO MRI

## 2014-07-11 NOTE — ED Provider Notes (Signed)
Signed out at [facility] by MD Zavitz/PA Pisciotta, that mri pending, if negative, to d/c to home.    Mri neg.  Recheck pt comfortable. No dizziness or faintness. Pt states episodes in last month at rest, not related to position or activity level.  No syncope. States had holter approx 12 yrs ago ?pvcs.  Per tx team plan, mri neg, and pt currently appears medically stable for d/c.      Mirna Mires, MD 07/11/14 1710

## 2014-07-13 ENCOUNTER — Encounter: Payer: Self-pay | Admitting: Medical

## 2014-07-13 ENCOUNTER — Ambulatory Visit (INDEPENDENT_AMBULATORY_CARE_PROVIDER_SITE_OTHER): Payer: BC Managed Care – PPO | Admitting: Medical

## 2014-07-13 VITALS — BP 125/88 | HR 85 | Temp 98.6°F | Wt 260.4 lb

## 2014-07-13 DIAGNOSIS — Z8639 Personal history of other endocrine, nutritional and metabolic disease: Secondary | ICD-10-CM

## 2014-07-13 DIAGNOSIS — R51 Headache: Secondary | ICD-10-CM

## 2014-07-13 DIAGNOSIS — I499 Cardiac arrhythmia, unspecified: Secondary | ICD-10-CM | POA: Insufficient documentation

## 2014-07-13 DIAGNOSIS — I480 Paroxysmal atrial fibrillation: Secondary | ICD-10-CM

## 2014-07-13 DIAGNOSIS — R42 Dizziness and giddiness: Secondary | ICD-10-CM

## 2014-07-13 DIAGNOSIS — R519 Headache, unspecified: Secondary | ICD-10-CM | POA: Insufficient documentation

## 2014-07-13 DIAGNOSIS — Z862 Personal history of diseases of the blood and blood-forming organs and certain disorders involving the immune mechanism: Secondary | ICD-10-CM

## 2014-07-13 DIAGNOSIS — I4891 Unspecified atrial fibrillation: Secondary | ICD-10-CM

## 2014-07-13 LAB — HEMOGLOBIN A1C: Hgb A1c MFr Bld: 5.5 % (ref 4.6–6.5)

## 2014-07-13 LAB — SEDIMENTATION RATE: Sed Rate: 10 mm/hr (ref 0–22)

## 2014-07-13 MED ORDER — SUMATRIPTAN SUCCINATE 50 MG PO TABS: ORAL_TABLET | ORAL | Status: DC

## 2014-07-13 NOTE — Progress Notes (Signed)
Pre-visit discussion using our clinic review tool. No additional management support is needed unless otherwise documented below in the visit note.  

## 2014-07-13 NOTE — Progress Notes (Signed)
Subjective:    Patient ID: Jodi Bailey, female    DOB: [birth year], 47 y.o.   MRN: [id number]  HPI  Pt states that she has been having on and off light headed, dizzy, ha on and off for 6-8 weeks. Pt states these spells last 15 minutes-2 hours. Friday symptoms lasted 2 hours at the end of the day. 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 Sometimes will get light sensitive ha. Pain back of head and sometimes around her eye. Pt early today on way to work had a HA and she took advil. Pt took 3-4 advil usually. Pt states since wed of last week she had symptoms daily. Before last Wednesday was having 3-4 days of break in symptoms. Pt went to went to ED on Monday the 10th. She had both ct and mri. Those studies neg. Also had lab work but those studies were negative(neg d-dimer, neg troponin, cbc, bmp). EKG in ED was normal in ED.  Pt states ED did recommended seeing a cardiologist. Occasionaly she may feel heart race with symptoms. In past pt had holter monitor. Per pt had pvc and maybe atrial fibrillation.(Maybe paroxysmal?) In past was on atenolol. That was 12-15 years ago.  Currently asymptomatic.  Past Medical History  Diagnosis Date  . Anxiety   . Depression   . Chronic headaches   . Palpitations     History   Social History  . Marital Status: Married    Spouse Name: N/A    Number of Children: N/A  . Years of Education: N/A   Occupational History  . Not on file.   Social History Main Topics  . Smoking status: Never Smoker   . Smokeless tobacco: Not on file  . Alcohol Use: Yes     Comment: RARELY  . Drug Use: No  . Sexual Activity: Not on file   Other Topics Concern  . Not on file   Social History Narrative  . No narrative on file    No past surgical history on file.  Family History  Problem Relation Age of Onset  . Depression Sister     situational  . Depression Son   . Alcohol abuse Neg Hx   . Suicidality Son     Allergies  Allergen Reactions  .  Codeine     REACTION: rash,vomitting    Current Outpatient Prescriptions on File Prior to Visit  Medication Sig Dispense Refill  . BIOTIN PO Take 1 tablet by mouth daily.      Marland Kitchen buPROPion (WELLBUTRIN XL) 150 MG 24 hr tablet Take 300 mg by mouth daily.      . citalopram (CELEXA) 40 MG tablet Take 40 mg by mouth daily.      Marland Kitchen ibuprofen (ADVIL,MOTRIN) 200 MG tablet Take 400 mg by mouth every 8 (eight) hours as needed for headache or mild pain.      . Multiple Vitamins-Minerals (MULTIVITAMIN PO) Take 1 tablet by mouth daily.      . vitamin B-12 (CYANOCOBALAMIN) 1000 MCG tablet Take 1,000 mcg by mouth daily.       No current facility-administered medications on file prior to visit.    BP 125/88  Pulse 85  Temp(Src) 98.6 F (37 C) (Oral)  Wt 260 lb 6 oz (118.105 kg)  SpO2 97%  LMP 07/01/2014    Past Medical History  Diagnosis Date  . Anxiety   . Depression   . Chronic headaches   . Palpitations  History   Social History  . Marital Status: Married    Spouse Name: N/A    Number of Children: N/A  . Years of Education: N/A   Occupational History  . Not on file.   Social History Main Topics  . Smoking status: Never Smoker   . Smokeless tobacco: Not on file  . Alcohol Use: Yes     Comment: RARELY  . Drug Use: No  . Sexual Activity: Not on file   Other Topics Concern  . Not on file   Social History Narrative  . No narrative on file    No past surgical history on file.  Family History  Problem Relation Age of Onset  . Depression Sister     situational  . Depression Son   . Alcohol abuse Neg Hx   . Suicidality Son     Allergies  Allergen Reactions  . Codeine     REACTION: rash,vomitting    Current Outpatient Prescriptions on File Prior to Visit  Medication Sig Dispense Refill  . BIOTIN PO Take 1 tablet by mouth daily.      Marland Kitchen buPROPion (WELLBUTRIN XL) 150 MG 24 hr tablet Take 300 mg by mouth daily.      . citalopram (CELEXA) 40 MG tablet Take 40 mg  by mouth daily.      Marland Kitchen ibuprofen (ADVIL,MOTRIN) 200 MG tablet Take 400 mg by mouth every 8 (eight) hours as needed for headache or mild pain.      . Multiple Vitamins-Minerals (MULTIVITAMIN PO) Take 1 tablet by mouth daily.      . vitamin B-12 (CYANOCOBALAMIN) 1000 MCG tablet Take 1,000 mcg by mouth daily.       No current facility-administered medications on file prior to visit.    BP 125/88  Pulse 85  Temp(Src) 98.6 F (37 C) (Oral)  Wt 260 lb 6 oz (118.105 kg)  SpO2 97%  LMP 07/01/2014    Review of Systems  Constitutional: Negative for fever and diaphoresis.  Eyes: Positive for photophobia. Negative for pain, redness, itching and visual disturbance.       When she has these events but none presently.  Respiratory: Negative for cough, chest tightness, shortness of breath and wheezing.   Cardiovascular: Negative for chest pain and palpitations.       None presently but at times does have sensation of heart racing a little bit. This does coincide at times with the headache and dizzy episodes.  Gastrointestinal: Negative.   Musculoskeletal: Negative.   Neurological: Positive for dizziness and headaches. Negative for seizures, syncope, facial asymmetry, speech difficulty and weakness.       None presently but both are present when she has the described episodes.  Hematological: Negative for adenopathy. Does not bruise/bleed easily.  Psychiatric/Behavioral: Negative for suicidal ideas, hallucinations, behavioral problems, confusion, sleep disturbance, dysphoric mood and agitation. The patient is not nervous/anxious.        She is both on bupropion and Zoloft. She describes her mood as good. No problems and in fact she wants to consider getting off of these medications. I did advise her today that with current symptoms occurring frequently I would not recommend that. However once referral to cardiologist made and her headaches have improved then would consider consulting with her PCP on  possibly tapering off either one or both of her psychiatric medications.       Objective:   Physical Exam  General Mental Status- Alert. General Appearance- Not in acute distress.  Skin General: Color- Normal Color. Moisture- Normal Moisture.  Neck Carotid Arteries- Normal color. Moisture- Normal Moisture. No carotid bruits. No JVD.  Chest and Lung Exam Auscultation: Breath Sounds:-Normal, even and unlabored bilaterally.  Cardiovascular Auscultation:Rythm- Regular, rate, and rhythm. Murmurs & Other Heart Sounds:Auscultation of the heart reveals- No Murmurs.  Abdomen Inspection:-Inspeection Normal. Palpation/Percussion:Note:No mass. Palpation and Percussion of the abdomen reveal- Non Tender, Non Distended + BS, no rebound or guarding.    Neurologic Cranial Nerve exam:- CN III-XII intact(No nystagmus), symmetric smile. Drift Test:- No drift. Romberg Exam:- Negative.  Heal to Toe Gait exam:-Normal. Finger to Nose:- Normal/Intact Strength:- 5/5 equal and symmetric strength both upper and lower extremities. Note on inspection of right temporal area no abnormality or dilated vessel.       Assessment & Plan:

## 2014-07-13 NOTE — Assessment & Plan Note (Signed)
By patient's description, negative labs and negative imaging studies I think it is reasonable to treat her for potential migraine headaches. I did write for Imitrex. She will take as directed and followup in 2 weeks. If her headaches are not improving would consider referral to neurologist. I did explain to patient that  if she has severe headaches with associated neurologic signs or symptoms that would advise emergency department evaluation even though recent studies were negative in the ED. Patient expressed understanding.

## 2014-07-13 NOTE — Assessment & Plan Note (Signed)
Patient has recent subjective sensation of heart speeding up at times with her occasional headache and dizziness. She does have a history of possible a PVC and atrial fibrillation in the past. That was 10 years or more ago. So I did go a head and write a referral to cardiology for possible Holter. They may possibly even do an echo.

## 2014-07-13 NOTE — Assessment & Plan Note (Signed)
These episodes are occurring with a headache. Patient expresses concern for diabetes. She did have gestational blood sugar increase but none since. I did decide to go ahead and do an A1c today.

## 2014-07-13 NOTE — Patient Instructions (Signed)
For your ha you can take advil if they are mild. But if you determine ha is coming on severe then take imitrex as directed. Imitrex was sent to your pharmacy. Please get labs done today. If at any point severe HA as described then ED evaluation. If ha are not  Improving will consider referral to neurologist.  For your history of pvc and  possible atrial fibrillation(with recent heart racing sensation at times )we will refer you to cardiologist.  Follow up in 2 weeks with pcp or as needed with myself.

## 2014-07-14 ENCOUNTER — Telehealth: Payer: Self-pay

## 2014-07-14 ENCOUNTER — Telehealth: Payer: Self-pay | Admitting: Medical

## 2014-07-14 NOTE — Telephone Encounter (Signed)
Please advise if you agree with the below information   KP

## 2014-07-14 NOTE — Telephone Encounter (Signed)
Letter faxed.      KP 

## 2014-07-14 NOTE — Telephone Encounter (Signed)
Caller name: Kamdyn Covel. 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 Please fax letter to [phone]

## 2014-07-14 NOTE — Telephone Encounter (Signed)
Caller name: Jodi Bailey  Relation to pt: self Call back number: [phone] mobile & [phone] work   Reason for call:   Pt is requesting a doctor note stating she was seen at the ER and office 8/10,8/11,8/12 please also indcate pt has been referred to a cardiologist appointment set for 08/12/14 and a follow up appointment with Dr. Etter Sjogren 08/29/14. Please fax letter to [phone]

## 2014-07-14 NOTE — ED Provider Notes (Signed)
Medical screening examination/treatment/procedure(s) were conducted as a shared visit with non-physician practitioner(s) or resident  and myself.  I personally evaluated the patient during the encounter and agree with the findings.   I have personally reviewed any xrays and/ or EKG's with the provider and I agree with interpretation.   Patient presents to the ER with dizziness, alert vision and intermittent double vision and recent falls. 5+ strength in UE and LE with f/e at major joints. Sensation to palpation intact in UE and LE. CNs 2-12 grossly intact.  EOMFI.  PERRL.   Finger nose and coordination intact bilateral.   Visual fields intact to finger testing. Patient is cautious walking in the ER. Plan for MRI to rule out posterior concerns. Patient signed out to followup results.  Headache, Vertigo  Mariea Clonts, MD 07/14/14 [phone]

## 2014-07-14 NOTE — Telephone Encounter (Signed)
I don't mind giving her a note. Would you print that out. But if you could clarify with her. I saw that she was seen on the 10th. Did work up at ED carry her through the 11th.(or did she feel bad enough that did not go to work on the 11th.) Then I saw her on the 12th.  Also would you mind sending Darnell Level or Lesleigh Noe a note regarding pt referral date request as 08-12-2014.

## 2014-07-29 ENCOUNTER — Ambulatory Visit: Payer: BC Managed Care – PPO | Admitting: Family Medicine

## 2014-08-12 ENCOUNTER — Institutional Professional Consult (permissible substitution): Payer: BC Managed Care – PPO | Admitting: Cardiovascular Disease

## 2014-09-29 ENCOUNTER — Institutional Professional Consult (permissible substitution): Payer: BC Managed Care – PPO | Admitting: Cardiovascular Disease

## 2016-09-26 ENCOUNTER — Encounter: Payer: Self-pay | Admitting: Family

## 2016-09-26 ENCOUNTER — Telehealth: Payer: Self-pay | Admitting: Family Medicine

## 2016-09-26 ENCOUNTER — Ambulatory Visit (INDEPENDENT_AMBULATORY_CARE_PROVIDER_SITE_OTHER): Payer: BLUE CROSS/BLUE SHIELD | Admitting: Family

## 2016-09-26 DIAGNOSIS — H9203 Otalgia, bilateral: Secondary | ICD-10-CM | POA: Diagnosis not present

## 2016-09-26 NOTE — Progress Notes (Signed)
Pre visit review using our clinic review tool, if applicable. No additional management support is needed unless otherwise documented below in the visit note. 

## 2016-09-26 NOTE — Telephone Encounter (Signed)
yes

## 2016-09-26 NOTE — Assessment & Plan Note (Signed)
Symptoms and exam are consisted with impacted cerumen. Both ears were cleaned out with warm water mixed with Docusate Sodium. There was wax return from both ears however some wax was unable to be removed. Recommend continued treatment with over the counter kits. No evidence of infection. Follow up if symptoms worsen or do not improve.

## 2016-09-26 NOTE — Patient Instructions (Signed)
Thank you for choosing [facility].  SUMMARY AND INSTRUCTIONS:  Nice to meet you.   You have impacted cerumen or ear wax in your ears.  Recommend Debrox or Murine at home kits for ear cleaning.  If you symptoms do not improve please let us know.   Follow up:  If your symptoms worsen or fail to improve, please contact our office for further instruction, or in case of emergency go directly to the emergency room at the closest medical facility.    Cerumen Impaction The structures of the external ear canal secrete a waxy substance known as cerumen. Excess cerumen can build up in the ear canal, causing a condition known as cerumen impaction. Cerumen impaction can cause ear pain and disrupt the function of the ear. The rate of cerumen production differs for each individual. In certain individuals, the configuration of the ear canal may decrease his or her ability to naturally remove cerumen. CAUSES Cerumen impaction is caused by excessive cerumen production or buildup. RISK FACTORS  Frequent use of swabs to clean ears.  Having narrow ear canals.  Having eczema.  Being dehydrated. SIGNS AND SYMPTOMS  Diminished hearing.  Ear drainage.  Ear pain.  Ear itch. TREATMENT Treatment may involve:  Over-the-counter or prescription ear drops to soften the cerumen.  Removal of cerumen by a health care provider. This may be done with:  Irrigation with warm water. This is the most common method of removal.  Ear curettes and other instruments.  Surgery. This may be done in severe cases. HOME CARE INSTRUCTIONS  Take medicines only as directed by your health care provider.  Do not insert objects into the ear with the intent of cleaning the ear. PREVENTION  Do not insert objects into the ear, even with the intent of cleaning the ear. Removing cerumen as a part of normal hygiene is not necessary, and the use of swabs in the ear canal is not recommended.  Drink enough water  to keep your urine clear or pale yellow.  Control your eczema if you have it. SEEK MEDICAL CARE IF:  You develop ear pain.  You develop bleeding from the ear.  The cerumen does not clear after you use ear drops as directed.   This information is not intended to replace advice given to you by your health care provider. Make sure you discuss any questions you have with your health care provider.   Document Released: 12/26/2004 Document Revised: 12/09/2014 Document Reviewed: 07/05/2015 Elsevier Interactive Patient Education [employer].

## 2016-09-26 NOTE — Progress Notes (Signed)
Subjective:    Patient ID: Jodi Bailey, female    DOB: [birth year], 50 y.o.   MRN: [id number]  Chief Complaint  Patient presents with  . Otalgia    off/on x a few weeks  . Ear Fullness    Feel full and c/o some tenderness, some decreased hearing x 4-5 days    HPI:  Jodi Bailey is a 50 y.o. female who  has a past medical history of Anxiety; Chronic headaches; Depression; and Palpitations. and presents today for an acute office visit.  This is a new problem. Associated symptom of otalgia located in her left ear has been going on for a couple of weeks . Describes a feeling a fullness and there is some tenderness and decreased hearing.  Describes hearing like she is underwater. There has been a small amount of discharge.which stopped. Modifying factors include trying to pop her ears.     Allergies  Allergen Reactions  . Codeine     REACTION: rash,vomitting      Outpatient Medications Prior to Visit  Medication Sig Dispense Refill  . ibuprofen (ADVIL,MOTRIN) 200 MG tablet Take 400 mg by mouth every 8 (eight) hours as needed for headache or mild pain.    . Multiple Vitamins-Minerals (MULTIVITAMIN PO) Take 1 tablet by mouth daily.    . vitamin B-12 (CYANOCOBALAMIN) 1000 MCG tablet Take 1,000 mcg by mouth daily.    Marland Kitchen BIOTIN PO Take 1 tablet by mouth daily.    Marland Kitchen buPROPion (WELLBUTRIN XL) 150 MG 24 hr tablet Take 300 mg by mouth daily.    . citalopram (CELEXA) 40 MG tablet Take 40 mg by mouth daily.    . SUMAtriptan (IMITREX) 50 MG tablet 1 tab po at onset severe HA, then repeat in 2 hours if needed. Max of two tabs  Within a 24 hour period. (Patient not taking: Reported on 09/26/2016) 10 tablet 0   No facility-administered medications prior to visit.      Review of Systems  Constitutional: Negative for chills and fever.  HENT: Positive for ear pain. Negative for congestion, ear discharge, sinus pressure and sore throat.   Respiratory: Negative for cough.     Neurological: Negative for headaches.      Objective:    BP 130/88   Pulse 86   Temp 98.7 F (37.1 C) (Oral)   Wt 258 lb (117 kg)   SpO2 94%   BMI 40.41 kg/m  Nursing note and vital signs reviewed.  Physical Exam  Constitutional: She is oriented to person, place, and time. She appears well-developed and well-nourished. No distress.  HENT:  Right Ear: External ear normal. No drainage, swelling or tenderness. No foreign bodies. No middle ear effusion. Decreased hearing is noted.  Left Ear: No drainage, swelling or tenderness. No foreign bodies.  No middle ear effusion. Decreased hearing is noted.  Impacted cerumen noted bilaterally.  Cardiovascular: Normal rate, regular rhythm, normal heart sounds and intact distal pulses.   Pulmonary/Chest: Effort normal and breath sounds normal.  Neurological: She is alert and oriented to person, place, and time.  Skin: Skin is warm and dry.  Psychiatric: She has a normal mood and affect. Her behavior is normal. Judgment and thought content normal.       Assessment & Plan:   Problem List Items Addressed This Visit      Other   Otalgia of both ears    Symptoms and exam are consisted with impacted cerumen. Both ears were cleaned out  with warm water mixed with Docusate Sodium. There was wax return from both ears however some wax was unable to be removed. Recommend continued treatment with over the counter kits. No evidence of infection. Follow up if symptoms worsen or do not improve.         Other Visit Diagnoses   None.      I am having Jodi Bailey maintain her buPROPion, citalopram, ibuprofen, Multiple Vitamins-Minerals (MULTIVITAMIN PO), BIOTIN PO, vitamin B-12, and SUMAtriptan.   Follow-up: Return if symptoms worsen or fail to improve.  Mauricio Po, FNP

## 2016-09-26 NOTE — Telephone Encounter (Signed)
Pt would like to transfer from [facility] to [place] due to location .  Is this ok?

## 2016-09-29 NOTE — Telephone Encounter (Signed)
[place] with me. No establish appointment needed.

## 2016-10-15 ENCOUNTER — Other Ambulatory Visit (INDEPENDENT_AMBULATORY_CARE_PROVIDER_SITE_OTHER): Payer: BLUE CROSS/BLUE SHIELD

## 2016-10-15 ENCOUNTER — Encounter: Payer: Self-pay | Admitting: Family

## 2016-10-15 ENCOUNTER — Ambulatory Visit (INDEPENDENT_AMBULATORY_CARE_PROVIDER_SITE_OTHER): Payer: BLUE CROSS/BLUE SHIELD | Admitting: Family

## 2016-10-15 VITALS — BP 124/88 | HR 75 | Temp 98.0°F | Resp 16 | Ht 67.0 in | Wt 260.0 lb

## 2016-10-15 DIAGNOSIS — Z0000 Encounter for general adult medical examination without abnormal findings: Secondary | ICD-10-CM | POA: Diagnosis not present

## 2016-10-15 DIAGNOSIS — Z23 Encounter for immunization: Secondary | ICD-10-CM

## 2016-10-15 LAB — CBC
HEMATOCRIT: 43.9 % (ref 36.0–46.0)
HEMOGLOBIN: 14.8 g/dL (ref 12.0–15.0)
MCHC: 33.8 g/dL (ref 30.0–36.0)
MCV: 88.1 fl (ref 78.0–100.0)
PLATELETS: 259 10*3/uL (ref 150.0–400.0)
RBC: 4.98 Mil/uL (ref 3.87–5.11)
RDW: 13.7 % (ref 11.5–15.5)
WBC: 9.2 10*3/uL (ref 4.0–10.5)

## 2016-10-15 LAB — COMPREHENSIVE METABOLIC PANEL
ALBUMIN: 4.1 g/dL (ref 3.5–5.2)
ALT: 26 U/L (ref 0–35)
AST: 23 U/L (ref 0–37)
Alkaline Phosphatase: 76 U/L (ref 39–117)
BUN: 17 mg/dL (ref 6–23)
CALCIUM: 9.4 mg/dL (ref 8.4–10.5)
CO2: 29 mEq/L (ref 19–32)
CREATININE: 0.93 mg/dL (ref 0.40–1.20)
Chloride: 103 mEq/L (ref 96–112)
GFR: 67.92 mL/min (ref >60.00)
Glucose, Bld: 101 mg/dL — ABNORMAL HIGH (ref 70–99)
POTASSIUM: 4.5 meq/L (ref 3.5–5.1)
Sodium: 139 mEq/L (ref 135–145)
Total Bilirubin: 0.8 mg/dL (ref 0.2–1.2)
Total Protein: 7.3 g/dL (ref 6.0–8.3)

## 2016-10-15 LAB — LIPID PANEL
CHOLESTEROL: 196 mg/dL (ref 0–200)
HDL: 46.4 mg/dL (ref >39.00)
LDL CALC: 125 mg/dL — AB (ref 0–99)
NonHDL: 149.54
TRIGLYCERIDES: 121 mg/dL (ref 0.0–149.0)
Total CHOL/HDL Ratio: 4
VLDL: 24.2 mg/dL (ref 0.0–40.0)

## 2016-10-15 LAB — VITAMIN D 25 HYDROXY (VIT D DEFICIENCY, FRACTURES): VITD: 57.22 ng/mL (ref 30.00–100.00)

## 2016-10-15 LAB — HEMOGLOBIN A1C: HEMOGLOBIN A1C: 5.4 % (ref 4.6–6.5)

## 2016-10-15 NOTE — Assessment & Plan Note (Signed)
1) Anticipatory Guidance: Discussed importance of wearing a seatbelt while driving and not texting while driving; changing batteries in smoke detector at least once annually; wearing suntan lotion when outside; eating a balanced and moderate diet; getting physical activity at least 30 minutes per day.  2) Immunizations / Screenings / Labs:   Influenza updated today. Declines tetanus. All other immunizations are up-to-date per recommendations. All screenings are up-to-date per recommendations. Obtain CBC, CMET, and lipid profile.  Given obesity and family history obtain A1c for diabetic screening.  Overall well exam with risk factors for cardiovascular disease including obesity. Recommend weight loss of 5-10% of current body weight through nutrition and physical activity changes. Continue other healthy lifestyle behaviors and choices. She is experiencing additional stresses she is undergoing a divorce. Counseled on stress and stress management. Follow-up prevention exam in 1 year. Follow-up office visit pending blood work as needed.

## 2016-10-15 NOTE — Progress Notes (Signed)
Subjective:    Patient ID: Jodi Bailey, female    DOB: 09/18/1966, 50 y.o.   MRN: [id number]  Chief Complaint  Patient presents with  . Establish Care    CPE, fasting    HPI:  Jodi Bailey is a 50 y.o. female who presents today for an annual wellness visit.   1) Health Maintenance -   Diet - Averages about 3 meals per day consisting of a regular diet; Caffeine intake of about 2-4 cups daily.  Exercise - No structured exercise.    2) Preventative Exams / Immunizations:  Dental -- Up to date  Vision -- Up to date   Health Maintenance  Topic Date Due  . HIV Screening  02/19/1982  . INFLUENZA VACCINE  07/02/2016  . TETANUS/TDAP  10/15/2017 (Originally 02/19/1986)  . PAP SMEAR  03/05/2018    Immunization History  Administered Date(s) Administered  . Influenza,inj,Quad PF,36+ Mos 08/31/2013, 10/15/2016     Allergies  Allergen Reactions  . Codeine     REACTION: rash,vomitting     Outpatient Medications Prior to Visit  Medication Sig Dispense Refill  . Multiple Vitamins-Minerals (MULTIVITAMIN PO) Take 1 tablet by mouth daily.    Marland Kitchen BIOTIN PO Take 1 tablet by mouth daily.    Marland Kitchen buPROPion (WELLBUTRIN XL) 150 MG 24 hr tablet Take 300 mg by mouth daily.    . citalopram (CELEXA) 40 MG tablet Take 40 mg by mouth daily.    Marland Kitchen ibuprofen (ADVIL,MOTRIN) 200 MG tablet Take 400 mg by mouth every 8 (eight) hours as needed for headache or mild pain.    . SUMAtriptan (IMITREX) 50 MG tablet 1 tab po at onset severe HA, then repeat in 2 hours if needed. Max of two tabs  Within a 24 hour period. (Patient not taking: Reported on 09/26/2016) 10 tablet 0  . vitamin B-12 (CYANOCOBALAMIN) 1000 MCG tablet Take 1,000 mcg by mouth daily.     No facility-administered medications prior to visit.      Past Medical History:  Diagnosis Date  . Anxiety   . Chronic headaches   . Depression   . Palpitations      History reviewed. No pertinent surgical history.   Family  History  Problem Relation Age of Onset  . Depression Sister     situational  . Depression Son   . Suicidality Son   . Crohn's disease Mother   . Diverticulitis Mother   . Hypertension Father   . Diabetes Father   . Heart disease Father   . Hyperlipidemia Maternal Grandmother   . Hypertension Maternal Grandmother   . Osteoporosis Maternal Grandmother   . Stroke Maternal Grandfather   . Colon cancer Paternal Grandmother   . Heart disease Paternal Grandfather   . Heart attack Paternal Grandfather   . Alcohol abuse Neg Hx      Social History   Social History  . Marital status: Married    Spouse name: N/A  . Number of children: 3  . Years of education: 68   Occupational History  . Part time employment    Social History Main Topics  . Smoking status: Never Smoker  . Smokeless tobacco: Never Used  . Alcohol use Yes     Comment: Very RARELY  . Drug use: No  . Sexual activity: Not on file   Other Topics Concern  . Not on file   Social History Narrative   Fun: [employer] out with family. Anything outdoors.  Denies abuse and feels safe at home.       Review of Systems  Constitutional: Denies fever, chills, fatigue, or significant weight gain/loss. HENT: Head: Denies headache or neck pain Ears: Denies changes in hearing, ringing in ears, earache, drainage Nose: Denies discharge, stuffiness, itching, nosebleed, sinus pain Throat: Denies sore throat, hoarseness, dry mouth, sores, thrush Eyes: Denies loss/changes in vision, pain, redness, blurry/double vision, flashing lights Cardiovascular: Denies chest pain/discomfort, tightness, palpitations, shortness of breath with activity, difficulty lying down, swelling, sudden awakening with shortness of breath Respiratory: Denies shortness of breath, cough, sputum production, wheezing Gastrointestinal: Denies dysphasia, heartburn, change in appetite, nausea, change in bowel habits, rectal bleeding, constipation, diarrhea, yellow  skin or eyes Genitourinary: Denies frequency, urgency, burning/pain, blood in urine, incontinence, change in urinary strength. Musculoskeletal: Denies muscle/joint pain, stiffness, back pain, redness or swelling of joints, trauma Skin: Denies rashes, lumps, itching, dryness, color changes, or hair/nail changes Neurological: Denies dizziness, fainting, seizures, weakness, numbness, tingling, tremor Psychiatric - Denies nervousness, stress, depression or memory loss Endocrine: Denies heat or cold intolerance, sweating, frequent urination, excessive thirst, changes in appetite Hematologic: Denies ease of bruising or bleeding     Objective:     BP 124/88 (BP Location: Left Arm, Patient Position: Sitting, Cuff Size: Large)   Pulse 75   Temp 98 F (36.7 C) (Oral)   Resp 16   Ht 5\' 7"  (1.702 m)   Wt 260 lb (117.9 kg)   SpO2 94%   BMI 40.72 kg/m  Nursing note and vital signs reviewed.  Physical Exam  Constitutional: She is oriented to person, place, and time. She appears well-developed and well-nourished.  HENT:  Head: Normocephalic.  Right Ear: Hearing, tympanic membrane, external ear and ear canal normal.  Left Ear: Hearing, tympanic membrane, external ear and ear canal normal.  Nose: Nose normal.  Mouth/Throat: Uvula is midline, oropharynx is clear and moist and mucous membranes are normal.  Eyes: Conjunctivae and EOM are normal. Pupils are equal, round, and reactive to light.  Neck: Neck supple. No JVD present. No tracheal deviation present. No thyromegaly present.  Cardiovascular: Normal rate, regular rhythm, normal heart sounds and intact distal pulses.   Pulmonary/Chest: Effort normal and breath sounds normal.  Abdominal: Soft. Bowel sounds are normal. She exhibits no distension and no mass. There is no tenderness. There is no rebound and no guarding.  Musculoskeletal: Normal range of motion. She exhibits no edema or tenderness.  Lymphadenopathy:    She has no cervical  adenopathy.  Neurological: She is alert and oriented to person, place, and time. She has normal reflexes. No cranial nerve deficit. She exhibits normal muscle tone. Coordination normal.  Skin: Skin is warm and dry.  Psychiatric: She has a normal mood and affect. Her behavior is normal. Judgment and thought content normal.       Assessment & Plan:   Problem List Items Addressed This Visit      Other   Routine general medical examination at a health care facility - Primary    1) Anticipatory Guidance: Discussed importance of wearing a seatbelt while driving and not texting while driving; changing batteries in smoke detector at least once annually; wearing suntan lotion when outside; eating a balanced and moderate diet; getting physical activity at least 30 minutes per day.  2) Immunizations / Screenings / Labs:   Influenza updated today. Declines tetanus. All other immunizations are up-to-date per recommendations. All screenings are up-to-date per recommendations. Obtain CBC, CMET, and lipid profile.  Given obesity and family history obtain A1c for diabetic screening.  Overall well exam with risk factors for cardiovascular disease including obesity. Recommend weight loss of 5-10% of current body weight through nutrition and physical activity changes. Continue other healthy lifestyle behaviors and choices. She is experiencing additional stresses she is undergoing a divorce. Counseled on stress and stress management. Follow-up prevention exam in 1 year. Follow-up office visit pending blood work as needed.       Relevant Orders   CBC   Comprehensive metabolic panel   Lipid panel   VITAMIN D 25 Hydroxy (Vit-D Deficiency, Fractures)   Hemoglobin A1c    Other Visit Diagnoses    Encounter for immunization       Relevant Orders   Flu Vaccine QUAD 36+ mos IM (Completed)       I have discontinued Ms. Naser's buPROPion, citalopram, ibuprofen, BIOTIN PO, vitamin B-12, and SUMAtriptan. I  am also having her maintain her Multiple Vitamins-Minerals (MULTIVITAMIN PO).   Follow-up: Return in about 6 months (around 04/14/2017), or if symptoms worsen or fail to improve.   Mauricio Po, FNP

## 2016-10-15 NOTE — Patient Instructions (Signed)
Thank you for choosing [facility].  SUMMARY AND INSTRUCTIONS:  Labs:  Please stop by the lab on the lower level of the building for your blood work. Your results will be released to MyChart (or called to you) after review, usually within 72 hours after test completion. If any changes need to be made, you will be notified at that same time.  1.) The lab is open from 7:30am to 5:30 pm Monday-Friday 2.) No appointment is necessary 3.) Fasting (if needed) is 6-8 hours after food and drink; black coffee and water are okay   Follow up:  If your symptoms worsen or fail to improve, please contact our office for further instruction, or in case of emergency go directly to the emergency room at the closest medical facility.    Health Maintenance, Female Introduction Adopting a healthy lifestyle and getting preventive care can go a long way to promote health and wellness. Talk with your health care provider about what schedule of regular examinations is right for you. This is a good chance for you to check in with your provider about disease prevention and staying healthy. In between checkups, there are plenty of things you can do on your own. Experts have done a lot of research about which lifestyle changes and preventive measures are most likely to keep you healthy. Ask your health care provider for more information. Weight and diet Eat a healthy diet  Be sure to include plenty of vegetables, fruits, low-fat dairy products, and lean protein.  Do not eat a lot of foods high in solid fats, added sugars, or salt.  Get regular exercise. This is one of the most important things you can do for your health.  Most adults should exercise for at least 150 minutes each week. The exercise should increase your heart rate and make you sweat (moderate-intensity exercise).  Most adults should also do strengthening exercises at least twice a week. This is in addition to the moderate-intensity  exercise. Maintain a healthy weight  Body mass index (BMI) is a measurement that can be used to identify possible weight problems. It estimates body fat based on height and weight. Your health care provider can help determine your BMI and help you achieve or maintain a healthy weight.  For females 20 years of age and older:  A BMI below 18.5 is considered underweight.  A BMI of 18.5 to 24.9 is normal.  A BMI of 25 to 29.9 is considered overweight.  A BMI of 30 and above is considered obese. Watch levels of cholesterol and blood lipids  You should start having your blood tested for lipids and cholesterol at 49 years of age, then have this test every 5 years.  You may need to have your cholesterol levels checked more often if:  Your lipid or cholesterol levels are high.  You are older than 50 years of age.  You are at high risk for heart disease. Cancer screening Lung Cancer  Lung cancer screening is recommended for adults 55-80 years old who are at high risk for lung cancer because of a history of smoking.  A yearly low-dose CT scan of the lungs is recommended for people who:  Currently smoke.  Have quit within the past 15 years.  Have at least a 30-pack-year history of smoking. A pack year is smoking an average of one pack of cigarettes a day for 1 year.  Yearly screening should continue until it has been 15 years since you quit.  Yearly   screening should stop if you develop a health problem that would prevent you from having lung cancer treatment. Breast Cancer  Practice breast self-awareness. This means understanding how your breasts normally appear and feel.  It also means doing regular breast self-exams. Let your health care provider know about any changes, no matter how small.  If you are in your 20s or 30s, you should have a clinical breast exam (CBE) by a health care provider every 1-3 years as part of a regular health exam.  If you are 40 or older, have a CBE  every year. Also consider having a breast X-ray (mammogram) every year.  If you have a family history of breast cancer, talk to your health care provider about genetic screening.  If you are at high risk for breast cancer, talk to your health care provider about having an MRI and a mammogram every year.  Breast cancer gene (BRCA) assessment is recommended for women who have family members with BRCA-related cancers. BRCA-related cancers include:  Breast.  Ovarian.  Tubal.  Peritoneal cancers.  Results of the assessment will determine the need for genetic counseling and BRCA1 and BRCA2 testing. Cervical Cancer  Your health care provider may recommend that you be screened regularly for cancer of the pelvic organs (ovaries, uterus, and vagina). This screening involves a pelvic examination, including checking for microscopic changes to the surface of your cervix (Pap test). You may be encouraged to have this screening done every 3 years, beginning at age 21.  For women ages 30-65, health care providers may recommend pelvic exams and Pap testing every 3 years, or they may recommend the Pap and pelvic exam, combined with testing for human papilloma virus (HPV), every 5 years. Some types of HPV increase your risk of cervical cancer. Testing for HPV may also be done on women of any age with unclear Pap test results.  Other health care providers may not recommend any screening for nonpregnant women who are considered low risk for pelvic cancer and who do not have symptoms. Ask your health care provider if a screening pelvic exam is right for you.  If you have had past treatment for cervical cancer or a condition that could lead to cancer, you need Pap tests and screening for cancer for at least 20 years after your treatment. If Pap tests have been discontinued, your risk factors (such as having a new sexual partner) need to be reassessed to determine if screening should resume. Some women have medical  problems that increase the chance of getting cervical cancer. In these cases, your health care provider may recommend more frequent screening and Pap tests. Colorectal Cancer  This type of cancer can be detected and often prevented.  Routine colorectal cancer screening usually begins at 50 years of age and continues through 49 years of age.  Your health care provider may recommend screening at an earlier age if you have risk factors for colon cancer.  Your health care provider may also recommend using home test kits to check for hidden blood in the stool.  A small camera at the end of a tube can be used to examine your colon directly (sigmoidoscopy or colonoscopy). This is done to check for the earliest forms of colorectal cancer.  Routine screening usually begins at age 50.  Direct examination of the colon should be repeated every 5-10 years through 49 years of age. However, you may need to be screened more often if early forms of precancerous polyps or small   growths are found. Skin Cancer  Check your skin from head to toe regularly.  Tell your health care provider about any new moles or changes in moles, especially if there is a change in a mole's shape or color.  Also tell your health care provider if you have a mole that is larger than the size of a pencil eraser.  Always use sunscreen. Apply sunscreen liberally and repeatedly throughout the day.  Protect yourself by wearing long sleeves, pants, a wide-brimmed hat, and sunglasses whenever you are outside. Heart disease, diabetes, and high blood pressure  High blood pressure causes heart disease and increases the risk of stroke. High blood pressure is more likely to develop in:  People who have blood pressure in the high end of the normal range (130-139/85-89 mm Hg).  People who are overweight or obese.  People who are African American.  If you are 18-39 years of age, have your blood pressure checked every 3-5 years. If you  are 40 years of age or older, have your blood pressure checked every year. You should have your blood pressure measured twice-once when you are at a hospital or clinic, and once when you are not at a hospital or clinic. Record the average of the two measurements. To check your blood pressure when you are not at a hospital or clinic, you can use:  An automated blood pressure machine at a pharmacy.  A home blood pressure monitor.  If you are between 55 years and 79 years old, ask your health care provider if you should take aspirin to prevent strokes.  Have regular diabetes screenings. This involves taking a blood sample to check your fasting blood sugar level.  If you are at a normal weight and have a low risk for diabetes, have this test once every three years after 49 years of age.  If you are overweight and have a high risk for diabetes, consider being tested at a younger age or more often. Preventing infection Hepatitis B  If you have a higher risk for hepatitis B, you should be screened for this virus. You are considered at high risk for hepatitis B if:  You were born in a country where hepatitis B is common. Ask your health care provider which countries are considered high risk.  Your parents were born in a high-risk country, and you have not been immunized against hepatitis B (hepatitis B vaccine).  You have HIV or AIDS.  You use needles to inject street drugs.  You live with someone who has hepatitis B.  You have had sex with someone who has hepatitis B.  You get hemodialysis treatment.  You take certain medicines for conditions, including cancer, organ transplantation, and autoimmune conditions. Hepatitis C  Blood testing is recommended for:  Everyone born from 1945 through 1965.  Anyone with known risk factors for hepatitis C. Sexually transmitted infections (STIs)  You should be screened for sexually transmitted infections (STIs) including gonorrhea and chlamydia  if:  You are sexually active and are younger than 49 years of age.  You are older than 49 years of age and your health care provider tells you that you are at risk for this type of infection.  Your sexual activity has changed since you were last screened and you are at an increased risk for chlamydia or gonorrhea. Ask your health care provider if you are at risk.  If you do not have HIV, but are at risk, it may be recommended that you   take a prescription medicine daily to prevent HIV infection. This is called pre-exposure prophylaxis (PrEP). You are considered at risk if:  You are sexually active and do not regularly use condoms or know the HIV status of your partner(s).  You take drugs by injection.  You are sexually active with a partner who has HIV. Talk with your health care provider about whether you are at high risk of being infected with HIV. If you choose to begin PrEP, you should first be tested for HIV. You should then be tested every 3 months for as long as you are taking PrEP. Pregnancy  If you are premenopausal and you may become pregnant, ask your health care provider about preconception counseling.  If you may become pregnant, take 400 to 800 micrograms (mcg) of folic acid every day.  If you want to prevent pregnancy, talk to your health care provider about birth control (contraception). Osteoporosis and menopause  Osteoporosis is a disease in which the bones lose minerals and strength with aging. This can result in serious bone fractures. Your risk for osteoporosis can be identified using a bone density scan.  If you are 65 years of age or older, or if you are at risk for osteoporosis and fractures, ask your health care provider if you should be screened.  Ask your health care provider whether you should take a calcium or vitamin D supplement to lower your risk for osteoporosis.  Menopause may have certain physical symptoms and risks.  Hormone replacement therapy may  reduce some of these symptoms and risks. Talk to your health care provider about whether hormone replacement therapy is right for you. Follow these instructions at home:  Schedule regular health, dental, and eye exams.  Stay current with your immunizations.  Do not use any tobacco products including cigarettes, chewing tobacco, or electronic cigarettes.  If you are pregnant, do not drink alcohol.  If you are breastfeeding, limit how much and how often you drink alcohol.  Limit alcohol intake to no more than 1 drink per day for nonpregnant women. One drink equals 12 ounces of beer, 5 ounces of wine, or 1 ounces of hard liquor.  Do not use street drugs.  Do not share needles.  Ask your health care provider for help if you need support or information about quitting drugs.  Tell your health care provider if you often feel depressed.  Tell your health care provider if you have ever been abused or do not feel safe at home. This information is not intended to replace advice given to you by your health care provider. Make sure you discuss any questions you have with your health care provider. Document Released: 06/03/2011 Document Revised: 04/25/2016 Document Reviewed: 08/22/2015  2017 Elsevier  

## 2017-01-02 DIAGNOSIS — M25471 Effusion, right ankle: Secondary | ICD-10-CM | POA: Insufficient documentation

## 2017-01-02 DIAGNOSIS — M25475 Effusion, left foot: Secondary | ICD-10-CM | POA: Insufficient documentation

## 2017-01-27 DIAGNOSIS — R519 Headache, unspecified: Secondary | ICD-10-CM | POA: Insufficient documentation

## 2017-01-30 ENCOUNTER — Ambulatory Visit (INDEPENDENT_AMBULATORY_CARE_PROVIDER_SITE_OTHER): Payer: BLUE CROSS/BLUE SHIELD | Admitting: Nurse Practitioner

## 2017-01-30 ENCOUNTER — Other Ambulatory Visit (INDEPENDENT_AMBULATORY_CARE_PROVIDER_SITE_OTHER): Payer: BLUE CROSS/BLUE SHIELD

## 2017-01-30 ENCOUNTER — Encounter: Payer: Self-pay | Admitting: Nurse Practitioner

## 2017-01-30 VITALS — BP 140/100 | HR 82 | Temp 98.2°F | Ht 67.0 in | Wt 265.0 lb

## 2017-01-30 DIAGNOSIS — R609 Edema, unspecified: Secondary | ICD-10-CM

## 2017-01-30 DIAGNOSIS — R030 Elevated blood-pressure reading, without diagnosis of hypertension: Secondary | ICD-10-CM

## 2017-01-30 DIAGNOSIS — R51 Headache: Secondary | ICD-10-CM | POA: Diagnosis not present

## 2017-01-30 DIAGNOSIS — R519 Headache, unspecified: Secondary | ICD-10-CM

## 2017-01-30 LAB — BASIC METABOLIC PANEL
BUN: 14 mg/dL (ref 6–23)
CO2: 28 mEq/L (ref 19–32)
CREATININE: 0.79 mg/dL (ref 0.40–1.20)
Calcium: 9.1 mg/dL (ref 8.4–10.5)
Chloride: 103 mEq/L (ref 96–112)
GFR: 81.9 mL/min (ref >60.00)
Glucose, Bld: 118 mg/dL — ABNORMAL HIGH (ref 70–99)
Potassium: 4.3 mEq/L (ref 3.5–5.1)
Sodium: 137 mEq/L (ref 135–145)

## 2017-01-30 MED ORDER — NAPROXEN 500 MG PO TABS: 500.0000 mg | ORAL_TABLET | Freq: Two times a day (BID) | ORAL | 0 refills | Status: DC | PRN

## 2017-01-30 MED ORDER — HYDROCHLOROTHIAZIDE 12.5 MG PO CAPS: 12.5000 mg | ORAL_CAPSULE | Freq: Every day | ORAL | 0 refills | Status: DC

## 2017-01-30 NOTE — Progress Notes (Signed)
Subjective:  Patient ID: Jodi Bailey, female    DOB: 1966-02-12  Age: 51 y.o. MRN: [id number]  CC: Headache (Started on Monday and dizziness is an accompaning symptom. Recent BP was elevated at 162/106) and Leg Swelling (Started early last month.)   Headache   This is a recurrent problem. The current episode started 1 to 4 weeks ago. The problem has been waxing and waning. The pain is located in the retro-orbital and temporal region. The pain does not radiate. The pain quality is not similar to prior headaches. The quality of the pain is described as aching. Associated symptoms include dizziness. Pertinent negatives include no abdominal pain, abnormal behavior, anorexia, back pain, blurred vision, coughing, drainage, ear pain, eye pain, eye redness, eye watering, facial sweating, fever, hearing loss, insomnia, loss of balance, muscle aches, nausea, neck pain, numbness, phonophobia, photophobia, rhinorrhea, scalp tenderness, seizures, sinus pressure, sore throat, swollen glands, tingling, tinnitus, visual change, vomiting, weakness or weight loss. Nothing aggravates the symptoms. She has tried acetaminophen for the symptoms. The treatment provided mild relief. Her past medical history is significant for cluster headaches and obesity. There is no history of cancer, hypertension, immunosuppression, migraine headaches, migraines in the family, recent head traumas, sinus disease or TMJ.   EDEMA: Bilateral LE edema: improves with elevation Getting worse due to new job which entail prolonged sitting. No pain with ambulation, no PND, no orthopnea, no chest pain. Has intermittent palpitations (chronic).  Outpatient Medications Prior to Visit  Medication Sig Dispense Refill  . Multiple Vitamins-Minerals (MULTIVITAMIN PO) Take 1 tablet by mouth daily.     No facility-administered medications prior to visit.     ROS See HPI  Objective:  BP (!) 140/100 (BP Location: Left Arm, Patient Position:  Sitting, Cuff Size: Large)   Pulse 82   Temp 98.2 F (36.8 C) (Oral)   Ht 5\' 7"  (1.702 m)   Wt 265 lb (120.2 kg)   SpO2 96%   BMI 41.50 kg/m   BP Readings from Last 3 Encounters:  01/30/17 (!) 140/100  10/15/16 124/88  09/26/16 130/88    Wt Readings from Last 3 Encounters:  01/30/17 265 lb (120.2 kg)  10/15/16 260 lb (117.9 kg)  09/26/16 258 lb (117 kg)    Physical Exam  Constitutional: She is oriented to person, place, and time. No distress.  HENT:  Right Ear: External ear normal.  Left Ear: External ear normal.  Nose: Nose normal.  Mouth/Throat: No oropharyngeal exudate.  Neck: Normal range of motion. Neck supple.  Cardiovascular: Normal rate, regular rhythm and normal heart sounds.   Pulmonary/Chest: Effort normal and breath sounds normal. No respiratory distress.  Abdominal: She exhibits no distension.  Musculoskeletal: Normal range of motion. She exhibits edema. She exhibits no tenderness.  Neurological: She is alert and oriented to person, place, and time.  Skin: Skin is warm and dry. No rash noted. No erythema.  Psychiatric: She has a normal mood and affect. Her behavior is normal.  Vitals reviewed.   Lab Results  Component Value Date   WBC 9.2 10/15/2016   HGB 14.8 10/15/2016   HCT 43.9 10/15/2016   PLT 259.0 10/15/2016   GLUCOSE 118 (H) 01/30/2017   CHOL 196 10/15/2016   TRIG 121.0 10/15/2016   HDL 46.40 10/15/2016   LDLCALC 125 (H) 10/15/2016   ALT 26 10/15/2016   AST 23 10/15/2016   NA 137 01/30/2017   K 4.3 01/30/2017   CL 103 01/30/2017   CREATININE 0.79 01/30/2017  BUN 14 01/30/2017   CO2 28 01/30/2017   TSH 1.26 06/14/2013   HGBA1C 5.4 10/15/2016    Dg Chest 2 View  Result Date: 07/11/2014 CLINICAL DATA:  Headache, dizziness EXAM: CHEST  2 VIEW COMPARISON:  None FINDINGS: Upper normal heart size. Normal mediastinal contours and pulmonary vascularity. Bronchitic changes without infiltrate, pleural effusion, or pneumothorax. No acute  osseous findings. IMPRESSION: Bronchitic changes. Electronically Signed   By: Lavonia Dana M.D.   On: 07/11/2014 12:34   Ct Head Wo Contrast  Result Date: 07/11/2014 CLINICAL DATA:  Intermittent dizziness for 1 month worse over past 4 days, headache, some falling EXAM: CT HEAD WITHOUT CONTRAST TECHNIQUE: Contiguous axial images were obtained from the base of the skull through the vertex without intravenous contrast. COMPARISON:  None FINDINGS: Normal ventricular morphology. No midline shift or mass effect. Normal appearance of brain parenchyma. No intracranial hemorrhage, mass lesion, or acute infarction. Visualized paranasal sinuses and mastoid air cells clear. Bones unremarkable. IMPRESSION: Normal exam. Electronically Signed   By: Lavonia Dana M.D.   On: 07/11/2014 12:44   Mr Brain Wo Contrast  Result Date: 07/11/2014 CLINICAL DATA:  Progressive vertigo and headaches over the last several weeks. Multiple falls. Diplopia. Atypical headache. EXAM: MRI HEAD WITHOUT CONTRAST TECHNIQUE: Multiplanar, multiecho pulse sequences of the brain and surrounding structures were obtained without intravenous contrast. COMPARISON:  CT head without contrast from the same day. FINDINGS: The diffusion-weighted images demonstrate no evidence for acute or subacute infarction. No hemorrhage or mass lesion is present. A single punctate subcortical T2 arm density in the left frontal lobe is within normal limits for age. No other significant white matter disease is present. The ventricles are of normal size. No significant extraaxial fluid collection is present. Flow is present in the major intracranial arteries. The globes and orbits are intact. Mild mucosal thickening is present in the right sphenoidal recess. The paranasal sinuses and mastoid air cells are otherwise clear. IMPRESSION: 1. Normal MRI appearance of the brain for age. 2. No acute or focal lesion to explain the patient's symptoms. Electronically Signed   By: Lawrence Santiago M.D.   On: 07/11/2014 16:54    Assessment & Plan:   [place]aki was seen today for headache and leg swelling.  Diagnoses and all orders for this visit:  Elevated BP without diagnosis of hypertension -     hydrochlorothiazide (MICROZIDE) 12.5 MG capsule; Take 1 capsule (12.5 mg total) by mouth daily. -     Basic Metabolic Panel (BMET); Future  Dependent edema -     hydrochlorothiazide (MICROZIDE) 12.5 MG capsule; Take 1 capsule (12.5 mg total) by mouth daily. -     Basic Metabolic Panel (BMET); Future  Nonintractable episodic headache, unspecified headache type -     naproxen (NAPROSYN) 500 MG tablet; Take 1 tablet (500 mg total) by mouth 2 (two) times daily as needed (for pain, take with food).   I am having Ms. Gortney start on hydrochlorothiazide and naproxen. I am also having her maintain her Multiple Vitamins-Minerals (MULTIVITAMIN PO).  Meds ordered this encounter  Medications  . hydrochlorothiazide (MICROZIDE) 12.5 MG capsule    Sig: Take 1 capsule (12.5 mg total) by mouth daily.    Dispense:  30 capsule    Refill:  0    Order Specific Question:   Supervising Provider    Answer:   Cassandria Anger [[id number]]  . naproxen (NAPROSYN) 500 MG tablet    Sig: Take 1 tablet (500 mg total)  by mouth 2 (two) times daily as needed (for pain, take with food).    Dispense:  30 tablet    Refill:  0    Order Specific Question:   Supervising Provider    Answer:   Cassandria Anger [[id number]]    Follow-up: Return in about 2 weeks (around 02/13/2017) for elevated BP and edema with Terri Piedra.  Wilfred Lacy, NP

## 2017-01-30 NOTE — Patient Instructions (Addendum)
Wear compression stocking during the day and off at night.  You can buy compression stocking at any pharmacy or [facility] in [place], [place].  Edema Edema is when you have too much fluid in your body or under your skin. Edema may make your legs, feet, and ankles swell up. Swelling is also common in looser tissues, like around your eyes. This is a common condition. It gets more common as you get older. There are many possible causes of edema. Eating too much salt (sodium) and being on your feet or sitting for a long time can cause edema in your legs, feet, and ankles. Hot weather may make edema worse. Edema is usually painless. Your skin may look swollen or shiny. Follow these instructions at home:  Keep the swollen body part raised (elevated) above the level of your heart when you are sitting or lying down.  Do not sit still or stand for a long time.  Do not wear tight clothes. Do not wear garters on your upper legs.  Exercise your legs. This can help the swelling go down.  Wear elastic bandages or support stockings as told by your doctor.  Eat a low-salt (low-sodium) diet to reduce fluid as told by your doctor.  Depending on the cause of your swelling, you may need to limit how much fluid you drink (fluid restriction).  Take over-the-counter and prescription medicines only as told by your doctor. Contact a doctor if:  Treatment is not working.  You have heart, liver, or kidney disease and have symptoms of edema.  You have sudden and unexplained weight gain. Get help right away if:  You have shortness of breath or chest pain.  You cannot breathe when you lie down.  You have pain, redness, or warmth in the swollen areas.  You have heart, liver, or kidney disease and get edema all of a sudden.  You have a fever and your symptoms get worse all of a sudden. Summary  Edema is when you have too much fluid in your body or under your skin.  Edema may make your legs, feet, and  ankles swell up. Swelling is also common in looser tissues, like around your eyes.  Raise (elevate) the swollen body part above the level of your heart when you are sitting or lying down.  Follow your doctor's instructions about diet and how much fluid you can drink (fluid restriction). This information is not intended to replace advice given to you by your health care provider. Make sure you discuss any questions you have with your health care provider. Document Released: 05/06/2008 Document Revised: 12/06/2016 Document Reviewed: 12/06/2016 Elsevier Interactive Patient Education  2017 [employer].

## 2017-01-30 NOTE — Progress Notes (Signed)
Pre visit review using our clinic review tool, if applicable. No additional management support is needed unless otherwise documented below in the visit note. 

## 2017-02-04 ENCOUNTER — Encounter (HOSPITAL_COMMUNITY): Payer: Self-pay

## 2017-02-04 ENCOUNTER — Emergency Department (HOSPITAL_COMMUNITY)
Admission: EM | Admit: 2017-02-04 | Discharge: 2017-02-04 | Disposition: A | Discharge status: Home / Self Care | Payer: BLUE CROSS/BLUE SHIELD | Attending: Emergency Medicine | Admitting: Emergency Medicine

## 2017-02-04 ENCOUNTER — Emergency Department (HOSPITAL_COMMUNITY): Payer: BLUE CROSS/BLUE SHIELD

## 2017-02-04 DIAGNOSIS — I10 Essential (primary) hypertension: Secondary | ICD-10-CM | POA: Diagnosis not present

## 2017-02-04 DIAGNOSIS — R079 Chest pain, unspecified: Secondary | ICD-10-CM | POA: Diagnosis present

## 2017-02-04 DIAGNOSIS — R0789 Other chest pain: Secondary | ICD-10-CM | POA: Diagnosis not present

## 2017-02-04 HISTORY — DX: Unspecified atrial fibrillation: I48.91

## 2017-02-04 HISTORY — DX: Essential (primary) hypertension: I10

## 2017-02-04 LAB — CBC
HCT: 46.1 % — ABNORMAL HIGH (ref 36.0–46.0)
HEMOGLOBIN: 15.5 g/dL — AB (ref 12.0–15.0)
MCH: 29.9 pg (ref 26.0–34.0)
MCHC: 33.6 g/dL (ref 30.0–36.0)
MCV: 88.8 fL (ref 78.0–100.0)
Platelets: 265 10*3/uL (ref 150–400)
RBC: 5.19 MIL/uL — ABNORMAL HIGH (ref 3.87–5.11)
RDW: 13.3 % (ref 11.5–15.5)
WBC: 8.3 10*3/uL (ref 4.0–10.5)

## 2017-02-04 LAB — BASIC METABOLIC PANEL
ANION GAP: 11 (ref 5–15)
BUN: 14 mg/dL (ref 6–20)
CALCIUM: 9.5 mg/dL (ref 8.9–10.3)
CHLORIDE: 104 mmol/L (ref 101–111)
CO2: 23 mmol/L (ref 22–32)
Creatinine, Ser: 0.86 mg/dL (ref 0.44–1.00)
GFR calc non Af Amer: >60 mL/min (ref >60)
Glucose, Bld: 92 mg/dL (ref 65–99)
Potassium: 3.8 mmol/L (ref 3.5–5.1)
Sodium: 138 mmol/L (ref 135–145)

## 2017-02-04 LAB — I-STAT TROPONIN, ED: TROPONIN I, POC: 0 ng/mL (ref 0.00–0.08)

## 2017-02-04 LAB — TROPONIN I: Troponin I: <0.03 ng/mL (ref <0.03)

## 2017-02-04 NOTE — Discharge Instructions (Signed)
Please read attached information. If you experience any new or worsening signs or symptoms please return to the emergency room for evaluation. Please follow-up with your primary care provider or specialist as discussed.  Please follow-up with cardiology this week for repeat assessment.

## 2017-02-04 NOTE — ED Triage Notes (Signed)
Onset this morning at [facility] pts son was being seen.  While sitting in office pt started having chest pressure, headache, dizziness, and clammy.  Pain lasted about 1 min and has had one more episode since.  Pt still with dizziness and headache.  No chest pain at this time.

## 2017-02-04 NOTE — ED Provider Notes (Signed)
[facility] Bailey DEPT Provider Note   CSN: [id number] Arrival date & time: 02/04/17  1426     History   Chief Complaint Chief Complaint  Patient presents with  . Chest Pain    HPI Jodi Bailey is a 51 y.o. female.  HPI   51 year old female presents today with complaints of chest pain.  Patient notes that she was at a doctor's appointment with her son who is having a viral upper respiratory infection.  She notes she started to have a acute onset of headache and dizzy spells, she notes a history of the same.  She reports the headache is generalized with no focal neurological deficits.  Patient's blood pressure at the primary care provider's office was 150/100 followed by another one that was 173/113.  She notes this was at approximately 1:20 PM and notes it felt like someone he was pushing on her chest.  She notes radiation into her arm and nd down into the left armpit.  Patient's sister was present and notes she appeared clammy.  Notes the symptoms lasted approximately 45 minutes and have dissipated.  She notes still a generalized headache but is not having any chest pain.  She denies any recent history of chest pain, notes that she has been fatiguing easily.  She also notes that since January she has developed hypertension with lower extremity edema.  She is being followed by her primary care who started her on HCTZ approximately 5 days ago which provided significant diuresis of her lower extremity edema.  Patient denies any unilateral swelling, denies any previous chest pain or acute shortness of breath.  She denies any history of PE or DVT.  She puts a history of atrial fibrillation in her 68s that was treated with atenolol, no recurrence.  She denies any history of cardiac dysfunction other than that noted, reports that her grandfather on both sides had heart attacks in their late 70s, no early ACS history in her family.  She is a non-smoker, no hyperlipidemia, only recent history of  hypertension.    Past Medical History:  Diagnosis Date  . Anxiety   . Atrial fibrillation ([place])   . Chronic headaches   . Depression   . Hypertension   . Palpitations     Patient Active Problem List   Diagnosis Date Noted  . Routine general medical examination at a health care facility 10/15/2016  . Otalgia of both ears 09/26/2016  . Headache(784.0) 07/13/2014  . Arrhythmia 07/13/2014  . Dizziness 07/13/2014  . Obesity (BMI 30-39.9) 03/16/2014  . Depression 08/31/2013  . LOW BACK PAIN SYNDROME 02/18/2011  . SKIN CANCER, HX OF 02/18/2011  . BACK PAIN 02/04/2011    History reviewed. No pertinent surgical history.  OB History    No data available       Home Medications    Prior to Admission medications   Medication Sig Start Date End Date Taking? Authorizing Provider  Cholecalciferol (VITAMIN D PO) Take 1 tablet by mouth daily.   Yes Historical Provider, MD  hydrochlorothiazide (MICROZIDE) 12.5 MG capsule Take 1 capsule (12.5 mg total) by mouth daily. 01/30/17  Yes Flossie Buffy, NP  Multiple Vitamins-Minerals (MULTIVITAMIN PO) Take 1 tablet by mouth daily.   Yes Historical Provider, MD  naproxen (NAPROSYN) 500 MG tablet Take 1 tablet (500 mg total) by mouth 2 (two) times daily as needed (for pain, take with food). Patient not taking: Reported on 02/04/2017 01/30/17   Flossie Buffy, NP    Family  History Family History  Problem Relation Age of Onset  . Depression Sister     situational  . Depression Son   . Suicidality Son   . Crohn's disease Mother   . Diverticulitis Mother   . Hypertension Father   . Diabetes Father   . Heart disease Father   . Hyperlipidemia Maternal Grandmother   . Hypertension Maternal Grandmother   . Osteoporosis Maternal Grandmother   . Stroke Maternal Grandfather   . Colon cancer Paternal Grandmother   . Heart disease Paternal Grandfather   . Heart attack Paternal Grandfather   . Alcohol abuse Neg Hx     Social  History Social History  Substance Use Topics  . Smoking status: Never Smoker  . Smokeless tobacco: Never Used  . Alcohol use Yes     Comment: Very RARELY    Allergies   Codeine   Review of Systems Review of Systems  All other systems reviewed and are negative.   Physical Exam Updated Vital Signs BP 131/84   Pulse 75   Temp 98.7 F (37.1 C) (Oral)   Resp 15   Ht 5\' 7"  (1.702 m)   Wt 120.2 kg Comment: pt was weighed 5 days ago in office  LMP 02/03/2017   SpO2 98%   BMI 41.50 kg/m   Physical Exam  Cardiovascular: Normal rate and regular rhythm.   Pulmonary/Chest: Effort normal. No respiratory distress. She has no wheezes. She has no rales. She exhibits no tenderness.  Abdominal: Soft.  Musculoskeletal: She exhibits edema.  Scant LEE  Skin: Skin is warm.  Nursing note and vitals reviewed.    ED Treatments / Results  Labs (all labs ordered are listed, but only abnormal results are displayed) Labs Reviewed  CBC - Abnormal; Notable for the following:       Result Value   RBC 5.19 (*)    Hemoglobin 15.5 (*)    HCT 46.1 (*)    All other components within normal limits  BASIC METABOLIC PANEL  TROPONIN I  I-STAT TROPOININ, ED    EKG  EKG Interpretation  Date/Time:  Tuesday February 04 2017 14:33:48 EST Ventricular Rate:  103 PR Interval:  182 QRS Duration: 70 QT Interval:  344 QTC Calculation: 450 R Axis:   -14 Text Interpretation:  Sinus tachycardia Cannot rule out Anterior infarct , age undetermined Abnormal ECG Confirmed by Alvino Chapel  MD, NATHAN [phone]) on 02/04/2017 7:33:32 PM       Radiology Dg Chest 2 View  Result Date: 02/04/2017 CLINICAL DATA:  Dizziness, headache, bilateral lower extremity swelling for the past month. Onset of chest pressure and left axillary pain with diaphoresis and hypertension today. History of hypertension and atrial fibrillation EXAM: CHEST  2 VIEW COMPARISON:  Chest x-ray of July 11, 2014 FINDINGS: The lungs are adequately  inflated. There is no focal infiltrate. There is no pleural effusion. The heart is top-normal in size. The pulmonary vascularity is normal. There is an approximately 3 mm diameter calcified nodule in the left upper lobe peripherally. This is not new but is more conspicuous than on the previous study. The mediastinum is normal in width. The bony thorax exhibits no acute abnormality. IMPRESSION: Mild cardiomegaly without pulmonary vascular congestion or pulmonary edema. No pneumonia. 3 mm diameter calcified nodule in the left upper lobe is more conspicuous than on the previous study. This likely reflects previous granulomatous infection but will merit moderate tearing on follow-up radiographs. Electronically Signed   By: David  Martinique M.D.  On: 02/04/2017 15:21    Procedures Procedures (including critical care time)  Medications Ordered in ED Medications - No data to display   Initial Impression / Assessment and Plan / ED Course  I have reviewed the triage vital signs and the nursing notes.  Pertinent labs & imaging results that were available during my care of the patient were reviewed by me and considered in my medical decision making (see chart for details).      Final Clinical Impressions(s) / ED Diagnoses   Final diagnoses:  Chest pain, unspecified type    Labs: troponin x 2, BMP CBC  Imaging: DG Chest 2 View  Consults:  Therapeutics:  Discharge Meds:   Assessment/Plan: 51 year old female presents today with complaints of chest pain.  Chest pain has completely resolved, patient feeling back to her baseline.  Patient has a heart score of 3.  Low suspicion for ACS in this patient. Pt will have delta troponin with close follow-up with cardiology as an outpatient. Pt case was discussed with Dr. Alvino Chapel who agreed to assessment and plan.  Patient verbalized understanding and agreement to today's plan, should her follow-up evaluation, and had no further questions or concerns at time  of discharge       New Prescriptions Discharge Medication List as of 02/04/2017  8:37 PM       Okey Regal, PA-C 02/04/17 2056    Davonna Belling, MD 02/10/17 [phone]

## 2017-02-12 ENCOUNTER — Encounter: Payer: Self-pay | Admitting: Internal Medicine

## 2017-02-12 ENCOUNTER — Ambulatory Visit (INDEPENDENT_AMBULATORY_CARE_PROVIDER_SITE_OTHER): Payer: BLUE CROSS/BLUE SHIELD | Admitting: Internal Medicine

## 2017-02-12 DIAGNOSIS — I10 Essential (primary) hypertension: Secondary | ICD-10-CM

## 2017-02-12 MED ORDER — VALSARTAN-HYDROCHLOROTHIAZIDE 160-25 MG PO TABS: 1.0000 | ORAL_TABLET | Freq: Every day | ORAL | 3 refills | Status: DC

## 2017-02-12 NOTE — Progress Notes (Signed)
   Subjective:    Patient ID: Jodi Bailey, female    DOB: 26-Oct-1966, 51 y.o.   MRN: [id number]  HPI  Here to f/u HTN and CP, seen in ED with detailed hx no change: has had recent URI viral symptoms, with HA but has HA and dizziness intermittently at baseline.  BP has been persistently elevated.  Had recent symptoms of short lived sometimes fleeting CP now resolved without radiation or assoc symptoms.  + fatigue easier recently, Recently started on HCT with elev BP and mild LE edema with improvement.  + hx of transient afib but no recurrence.  Has increased stress recenlty but Denies worsening depressive symptoms, suicidal ideation, or panic BP Readings from Last 3 Encounters:  02/12/17 138/80  02/04/17 131/84  01/30/17 (!) 140/100   Past Medical History:  Diagnosis Date  . Anxiety   . Atrial fibrillation ([place])   . Chronic headaches   . Depression   . Hypertension   . Palpitations    No past surgical history on file.  reports that she has never smoked. She has never used smokeless tobacco. She reports that she drinks alcohol. She reports that she does not use drugs. family history includes Colon cancer in her paternal grandmother; Crohn's disease in her mother; Depression in her sister and son; Diabetes in her father; Diverticulitis in her mother; Heart attack in her paternal grandfather; Heart disease in her father and paternal grandfather; Hyperlipidemia in her maternal grandmother; Hypertension in her father and maternal grandmother; Osteoporosis in her maternal grandmother; Stroke in her maternal grandfather; Suicidality in her son. Allergies  Allergen Reactions  . Codeine     REACTION: rash,vomitting   Current Outpatient Prescriptions on File Prior to Visit  Medication Sig Dispense Refill  . Cholecalciferol (VITAMIN D PO) Take 1 tablet by mouth daily.    . hydrochlorothiazide (MICROZIDE) 12.5 MG capsule Take 1 capsule (12.5 mg total) by mouth daily. 30 capsule 0  . Multiple  Vitamins-Minerals (MULTIVITAMIN PO) Take 1 tablet by mouth daily.    . naproxen (NAPROSYN) 500 MG tablet Take 1 tablet (500 mg total) by mouth 2 (two) times daily as needed (for pain, take with food). (Patient not taking: Reported on 02/04/2017) 30 tablet 0   No current facility-administered medications on file prior to visit.    Review of Systems All otherwise neg per pt     Objective:   Physical Exam BP 138/80   Pulse 83   Temp 98.2 F (36.8 C)   Wt 262 lb (118.8 kg)   LMP 02/03/2017   SpO2 100%   BMI 41.04 kg/m  VS noted, not ill appearing, morbid obese, very tense and stressed demeanor Constitutional: Pt appears in no apparent distress HENT: Head: NCAT.  Right Ear: External ear normal.  Left Ear: External ear normal.  Eyes: . Pupils are equal, round, and reactive to light. Conjunctivae and EOM are normal Neck: Normal range of motion. Neck supple.  Cardiovascular: Normal rate and regular rhythm.   Pulmonary/Chest: Effort normal and breath sounds without rales or wheezing.  Neurological: Pt is alert. Not confused , motor grossly intact Skin: Skin is warm. No rash, no LE edema Psychiatric: Pt behavior is normal. No agitation. 2+ nervous, not depressed affect No other exam findings    Assessment & Plan:

## 2017-02-12 NOTE — Progress Notes (Signed)
Pre visit review using our clinic review tool, if applicable. No additional management support is needed unless otherwise documented below in the visit note. 

## 2017-02-12 NOTE — Patient Instructions (Signed)
OK to stop the HCT 12.5 mg per day  Please take all new medication as prescribed - the Diovan HCT 160-25 mg - 1 per day  Please continue to monitor your Blood Pressure as you do; the goal is to be more often than not Less than 140/90  Please continue all other medications as before, and refills have been done if requested.  Please have the pharmacy call with any other refills you may need.  Please keep your appointments with your specialists as you may have planned  Please cancel the Appt tomorrow, and re-schedule with Terri Piedra in 1 week, for BP check and possible lab work

## 2017-02-13 ENCOUNTER — Ambulatory Visit: Payer: BLUE CROSS/BLUE SHIELD | Admitting: Family

## 2017-02-15 DIAGNOSIS — I10 Essential (primary) hypertension: Secondary | ICD-10-CM | POA: Insufficient documentation

## 2017-02-15 NOTE — Assessment & Plan Note (Signed)
Mild persistent uncontrolled, o/w stable overall by history and exam, recent data reviewed with pt, and pt to change med to diovan HCT 160/12.5 and f/u BP at home and next visit, consider increase to diovanHCT 320/25,  to f/u any worsening symptoms or concerns

## 2017-02-20 ENCOUNTER — Ambulatory Visit: Payer: BLUE CROSS/BLUE SHIELD | Admitting: Family

## 2017-02-24 ENCOUNTER — Encounter: Payer: Self-pay | Admitting: Family

## 2018-05-05 ENCOUNTER — Encounter: Payer: Self-pay | Admitting: Family

## 2018-05-05 ENCOUNTER — Ambulatory Visit (INDEPENDENT_AMBULATORY_CARE_PROVIDER_SITE_OTHER): Payer: 59 | Admitting: Family

## 2018-05-05 VITALS — BP 158/88 | HR 79 | Temp 97.7°F | Ht 67.0 in | Wt 285.0 lb

## 2018-05-05 DIAGNOSIS — I10 Essential (primary) hypertension: Secondary | ICD-10-CM

## 2018-05-05 MED ORDER — HYDROCHLOROTHIAZIDE 25 MG PO TABS: 25.0000 mg | ORAL_TABLET | Freq: Every day | ORAL | 0 refills | Status: DC

## 2018-05-05 NOTE — Progress Notes (Signed)
Jodi Bailey is a 52 y.o. female with the following history as recorded in Epic[employer]:  Patient Active Problem List   Diagnosis Date Noted  . Hypertension 02/15/2017  . Routine general medical examination at a health care facility 10/15/2016  . Otalgia of both ears 09/26/2016  . Headache(784.0) 07/13/2014  . Arrhythmia 07/13/2014  . Dizziness 07/13/2014  . Obesity (BMI 30-39.9) 03/16/2014  . Depression 08/31/2013  . LOW BACK PAIN SYNDROME 02/18/2011  . SKIN CANCER, HX OF 02/18/2011  . BACK PAIN 02/04/2011    Current Outpatient Medications  Medication Sig Dispense Refill  . acetaminophen (TYLENOL) 325 MG tablet Take 650 mg by mouth every 6 (six) hours as needed. As needed for pain/headaches    . Cholecalciferol (VITAMIN D PO) Take 1 tablet by mouth daily.    . hydrochlorothiazide (HYDRODIURIL) 25 MG tablet Take 1 tablet (25 mg total) by mouth daily. 90 tablet 0  . Multiple Vitamins-Minerals (MULTIVITAMIN PO) Take 1 tablet by mouth daily.     No current facility-administered medications for this visit.     Allergies: Codeine and Diovan [valsartan]  Past Medical History:  Diagnosis Date  . Anxiety   . Atrial fibrillation ([place])   . Chronic headaches   . Depression   . Hypertension   . Palpitations     History reviewed. No pertinent surgical history.  Family History  Problem Relation Age of Onset  . Depression Sister        situational  . Depression Son   . Suicidality Son   . Crohn's disease Mother   . Diverticulitis Mother   . Hypertension Father   . Diabetes Father   . Heart disease Father   . Hyperlipidemia Maternal Grandmother   . Hypertension Maternal Grandmother   . Osteoporosis Maternal Grandmother   . Stroke Maternal Grandfather   . Colon cancer Paternal Grandmother   . Heart disease Paternal Grandfather   . Heart attack Paternal Grandfather   . Alcohol abuse Neg Hx     Social History   Tobacco Use  . Smoking status: Never Smoker  . Smokeless  tobacco: Never Used  Substance Use Topics  . Alcohol use: Yes    Comment: Very RARELY    Subjective:  Patient presents to follow-up on her blood pressure; last seen in our office for blood pressure concerns in 2018;has a history of hypertension and has been off medication since at least November 2018; has been having increased headaches, ankle swelling/ "just not feeling well overall."  Had seen cardiologist at the end of March 2018- was changed from Diovan HCT to Atenolol as patient just did not feel well on Diovan; she does not remember if she responded well to this medication/ dosage/ how long she took it; Denies any chest pain or shortness of breath today; denies any dizziness; woke up this morning with her left eye red and became concerned about what the uncontrolled pressure was doing to her body.    Objective:  Vitals:   05/05/18 1110  BP: (!) 158/88  Pulse: 79  Temp: 97.7 F (36.5 C)  TempSrc: Oral  SpO2: 96%  Weight: 285 lb (129.3 kg)  Height: 5\' 7"  (1.702 m)    General: Well developed, well nourished, in no acute distress  Skin : Warm and dry.  Head: Normocephalic and atraumatic  Eyes: Sclera and conjunctiva clear; pupils round and reactive to light; extraocular movements intact  Ears: External normal; canals clear; tympanic membranes normal  Oropharynx: Pink, supple. No  suspicious lesions  Neck: Supple without thyromegaly, adenopathy  Lungs: Respirations unlabored; clear to auscultation bilaterally without wheeze, rales, rhonchi  CVS exam: normal rate and regular rhythm.  Neurologic: Alert and oriented; speech intact; face symmetrical; moves all extremities well; CNII-XII intact without focal deficit   Assessment:  1. Essential hypertension     Plan:  Re-start HCTZ 25 mg daily; will get copies of notes from cardiology evaluation in 2018- need to determine if beta blocker needs to be re-started; plan for fasting labs at next OV; return in 2 weeks; She is cautioned that  her home (wrist) cuff is not giving accurate readings for diastolic readings and needs to adjust her readings by 20 points; systolic reading is comparable.  Return in about 2 weeks (around 05/19/2018) for blood pressure check.  No orders of the defined types were placed in this encounter.   Requested Prescriptions   Signed Prescriptions Disp Refills  . hydrochlorothiazide (HYDRODIURIL) 25 MG tablet 90 tablet 0    Sig: Take 1 tablet (25 mg total) by mouth daily.

## 2018-05-11 ENCOUNTER — Telehealth: Payer: Self-pay | Admitting: Family

## 2018-05-11 MED ORDER — ATENOLOL 50 MG PO TABS: 50.0000 mg | ORAL_TABLET | Freq: Every day | ORAL | 0 refills | Status: DC

## 2018-05-11 NOTE — Addendum Note (Signed)
Addended by: Sherlene Shams on: 05/11/2018 03:54 PM   Modules accepted: Orders

## 2018-05-11 NOTE — Telephone Encounter (Signed)
Spoke with patient and info given 

## 2018-05-11 NOTE — Telephone Encounter (Signed)
Yes, I will send in Atenolol for her;

## 2018-05-11 NOTE — Telephone Encounter (Signed)
Please call and check on her today; how is her blood pressure doing? We did get notes from her cardiologist from last year. It looks like she saw them twice ( last visit in  April 2018) and was due in July 2018 for follow-up. She was on Atenolol 50 mg qd for blood pressure and Simvastatin 40 mg for cholesterol. Apparently, fatigue was discussed with cardiology but they thought might be due to the Simvastatin and not the Atenolol.  If her blood pressure is still elevated, let's go ahead and start Atenolol in addition to her HCTZ. Keep planned follow-up.

## 2018-05-11 NOTE — Telephone Encounter (Signed)
Spoke with patient and she states her pressure is still running about the same. She knows to keep appointment for next week. Did you want her to proceed with faxing in her Atenolol and I can call and have her pick up script.

## 2018-05-19 ENCOUNTER — Ambulatory Visit (INDEPENDENT_AMBULATORY_CARE_PROVIDER_SITE_OTHER): Payer: 59 | Admitting: Family

## 2018-05-19 ENCOUNTER — Other Ambulatory Visit (INDEPENDENT_AMBULATORY_CARE_PROVIDER_SITE_OTHER): Payer: 59

## 2018-05-19 ENCOUNTER — Encounter: Payer: Self-pay | Admitting: Family

## 2018-05-19 VITALS — BP 132/80 | HR 66 | Temp 97.8°F | Ht 67.0 in | Wt 283.0 lb

## 2018-05-19 DIAGNOSIS — Z8632 Personal history of gestational diabetes: Secondary | ICD-10-CM | POA: Diagnosis not present

## 2018-05-19 DIAGNOSIS — Z13220 Encounter for screening for lipoid disorders: Secondary | ICD-10-CM | POA: Diagnosis not present

## 2018-05-19 DIAGNOSIS — I10 Essential (primary) hypertension: Secondary | ICD-10-CM | POA: Diagnosis not present

## 2018-05-19 DIAGNOSIS — Z0000 Encounter for general adult medical examination without abnormal findings: Secondary | ICD-10-CM | POA: Diagnosis not present

## 2018-05-19 DIAGNOSIS — Z114 Encounter for screening for human immunodeficiency virus [HIV]: Secondary | ICD-10-CM

## 2018-05-19 DIAGNOSIS — R5383 Other fatigue: Secondary | ICD-10-CM

## 2018-05-19 DIAGNOSIS — Z23 Encounter for immunization: Secondary | ICD-10-CM

## 2018-05-19 DIAGNOSIS — O24419 Gestational diabetes mellitus in pregnancy, unspecified control: Secondary | ICD-10-CM

## 2018-05-19 LAB — VITAMIN D 25 HYDROXY (VIT D DEFICIENCY, FRACTURES): VITD: 26.75 ng/mL — ABNORMAL LOW (ref 30.00–100.00)

## 2018-05-19 LAB — CBC WITH DIFFERENTIAL/PLATELET
BASOS PCT: 0.7 % (ref 0.0–3.0)
Basophils Absolute: 0.1 10*3/uL (ref 0.0–0.1)
EOS ABS: 0.3 10*3/uL (ref 0.0–0.7)
EOS PCT: 3.4 % (ref 0.0–5.0)
HEMATOCRIT: 44 % (ref 36.0–46.0)
HEMOGLOBIN: 15 g/dL (ref 12.0–15.0)
LYMPHS PCT: 28 % (ref 12.0–46.0)
Lymphs Abs: 2.5 10*3/uL (ref 0.7–4.0)
MCHC: 34.2 g/dL (ref 30.0–36.0)
MCV: 87.9 fl (ref 78.0–100.0)
Monocytes Absolute: 0.6 10*3/uL (ref 0.1–1.0)
Monocytes Relative: 6.3 % (ref 3.0–12.0)
Neutro Abs: 5.5 10*3/uL (ref 1.4–7.7)
Neutrophils Relative %: 61.6 % (ref 43.0–77.0)
PLATELETS: 292 10*3/uL (ref 150.0–400.0)
RBC: 5 Mil/uL (ref 3.87–5.11)
RDW: 14.2 % (ref 11.5–15.5)
WBC: 8.9 10*3/uL (ref 4.0–10.5)

## 2018-05-19 LAB — TSH: TSH: 1.61 u[IU]/mL (ref 0.35–4.50)

## 2018-05-19 LAB — COMPREHENSIVE METABOLIC PANEL
ALBUMIN: 4.2 g/dL (ref 3.5–5.2)
ALT: 32 U/L (ref 0–35)
AST: 24 U/L (ref 0–37)
Alkaline Phosphatase: 89 U/L (ref 39–117)
BUN: 21 mg/dL (ref 6–23)
CALCIUM: 9.7 mg/dL (ref 8.4–10.5)
CHLORIDE: 99 meq/L (ref 96–112)
CO2: 30 meq/L (ref 19–32)
CREATININE: 0.98 mg/dL (ref 0.40–1.20)
GFR: 63.53 mL/min (ref >60.00)
Glucose, Bld: 120 mg/dL — ABNORMAL HIGH (ref 70–99)
POTASSIUM: 3.8 meq/L (ref 3.5–5.1)
SODIUM: 138 meq/L (ref 135–145)
Total Bilirubin: 1.3 mg/dL — ABNORMAL HIGH (ref 0.2–1.2)
Total Protein: 7.5 g/dL (ref 6.0–8.3)

## 2018-05-19 LAB — LIPID PANEL
Cholesterol: 210 mg/dL — ABNORMAL HIGH (ref 0–200)
HDL: 40.2 mg/dL (ref >39.00)
LDL CALC: 132 mg/dL — AB (ref 0–99)
NonHDL: 169.77
TRIGLYCERIDES: 190 mg/dL — AB (ref 0.0–149.0)
Total CHOL/HDL Ratio: 5
VLDL: 38 mg/dL (ref 0.0–40.0)

## 2018-05-19 LAB — HEMOGLOBIN A1C: Hgb A1c MFr Bld: 6.1 % (ref 4.6–6.5)

## 2018-05-19 NOTE — Patient Instructions (Signed)
Please schedule with your gynecologist;

## 2018-05-19 NOTE — Progress Notes (Addendum)
Jodi Bailey is a 52 y.o. female with the following history as recorded in EpicCare:  Patient Active Problem List   Diagnosis Date Noted  . Hypertension 02/15/2017  . Routine general medical examination at a health care facility 10/15/2016  . Otalgia of both ears 09/26/2016  . Headache(784.0) 07/13/2014  . Arrhythmia 07/13/2014  . Dizziness 07/13/2014  . Obesity (BMI 30-39.9) 03/16/2014  . Depression 08/31/2013  . LOW BACK PAIN SYNDROME 02/18/2011  . SKIN CANCER, HX OF 02/18/2011  . BACK PAIN 02/04/2011    Current Outpatient Medications  Medication Sig Dispense Refill  . acetaminophen (TYLENOL) 325 MG tablet Take 650 mg by mouth every 6 (six) hours as needed. As needed for pain/headaches    . atenolol (TENORMIN) 50 MG tablet Take 1 tablet (50 mg total) by mouth daily. 90 tablet 0  . Cholecalciferol (VITAMIN D PO) Take 1 tablet by mouth daily.    . hydrochlorothiazide (HYDRODIURIL) 25 MG tablet Take 1 tablet (25 mg total) by mouth daily. 90 tablet 0  . Multiple Vitamins-Minerals (MULTIVITAMIN PO) Take 1 tablet by mouth daily.    . Vitamin D, Ergocalciferol, (DRISDOL) 50000 units CAPS capsule Take 1 capsule (50,000 Units total) by mouth every 7 (seven) days for 12 doses. 12 capsule 0   No current facility-administered medications for this visit.     Allergies: Codeine and Diovan [valsartan]  Past Medical History:  Diagnosis Date  . Anxiety   . Atrial fibrillation ([place])   . Chronic headaches   . Depression   . Hypertension   . Palpitations     History reviewed. No pertinent surgical history.  Family History  Problem Relation Age of Onset  . Depression Sister        situational  . Depression Son   . Suicidality Son   . Crohn's disease Mother   . Diverticulitis Mother   . Hypertension Father   . Diabetes Father   . Heart disease Father   . Hyperlipidemia Maternal Grandmother   . Hypertension Maternal Grandmother   . Osteoporosis Maternal Grandmother   . Stroke  Maternal Grandfather   . Colon cancer Paternal Grandmother   . Heart disease Paternal Grandfather   . Heart attack Paternal Grandfather   . Alcohol abuse Neg Hx     Social History   Tobacco Use  . Smoking status: Never Smoker  . Smokeless tobacco: Never Used  Substance Use Topics  . Alcohol use: Yes    Comment: Very RARELY    Subjective:  Patient presents to follow-up on hypertension- recent start of Atenolol and HCTZ; feeling much better on the combination of medication; approximately 2 days ago, noticed that she really started to feel much better; Denies any chest pain, shortness of breath, blurred vision or headache.  Patient has no concerns on fatigue with atenolol; Is fasting today and would like to get labs updated;  Overdue for seeing GYN- planning to schedule;  Tdap- agreeable to updating today; Defers colonoscopy today; knows she needs to work on weight loss/ increased exercise. LMP- 11/2017  Review of Systems  Constitutional: Negative.   HENT: Negative.   Eyes: Negative.   Respiratory: Negative for shortness of breath.   Cardiovascular: Negative for chest pain and palpitations.  Gastrointestinal: Negative for abdominal pain, blood in stool, constipation and diarrhea.  Genitourinary: Negative.   Musculoskeletal: Negative.   Skin: Negative.   Neurological: Negative for dizziness and headaches.  Psychiatric/Behavioral: Negative.         Objective:  Vitals:   05/19/18 0845 05/19/18 0926  BP: (!) 148/80 132/80  Pulse: 66   Temp: 97.8 F (36.6 C)   TempSrc: Oral   SpO2: 96%   Weight: 283 lb 0.6 oz (128.4 kg)   Height: 5' 7" (1.702 m)     General: Well developed, well nourished, in no acute distress  Skin : Warm and dry.  Head: Normocephalic and atraumatic  Eyes: Sclera and conjunctiva clear; pupils round and reactive to light; extraocular movements intact  Ears: External normal; canals clear; tympanic membranes normal  Oropharynx: Pink, supple. No  suspicious lesions  Neck: Supple without thyromegaly, adenopathy  Lungs: Respirations unlabored; clear to auscultation bilaterally without wheeze, rales, rhonchi  CVS exam: normal rate and regular rhythm.  Abdomen: Soft; nontender; nondistended; normoactive bowel sounds; no masses or hepatosplenomegaly  Musculoskeletal: No deformities; no active joint inflammation  Extremities: No edema, cyanosis, clubbing  Vessels: Symmetric bilaterally  Neurologic: Alert and oriented; speech intact; face symmetrical; moves all extremities well; CNII-XII intact without focal deficit  Assessment:  1. PE (physical exam), annual   2. Essential hypertension   3. Lipid screening   4. Encounter for screening for HIV   5. History of gestational diabetes   6. Other fatigue     Plan:  Labs updated; doing well on current regimen for blood pressure; stressed need to see GYN- she agrees; she defers colonoscopy at this time; encouraged exercise, weight loss; Tdap updated; follow-up in 3 months, sooner prn.   No follow-ups on file.  Orders Placed This Encounter  Procedures  . Tdap vaccine greater than or equal to 7yo IM  . CBC w/Diff    Standing Status:   Future    Number of Occurrences:   1    Standing Expiration Date:   05/19/2019  . Comp Met (CMET)    Standing Status:   Future    Number of Occurrences:   1    Standing Expiration Date:   05/19/2019  . Lipid panel    Standing Status:   Future    Number of Occurrences:   1    Standing Expiration Date:   05/20/2019  . HIV antibody    Standing Status:   Future    Number of Occurrences:   1    Standing Expiration Date:   05/20/2019  . HgB A1c    Standing Status:   Future    Number of Occurrences:   1    Standing Expiration Date:   05/19/2019  . Vitamin D (25 hydroxy)    Standing Status:   Future    Number of Occurrences:   1    Standing Expiration Date:   05/19/2019  . TSH    Standing Status:   Future    Number of Occurrences:   1    Standing Expiration  Date:   05/19/2019    Requested Prescriptions    No prescriptions requested or ordered in this encounter

## 2018-05-20 LAB — HIV ANTIBODY (ROUTINE TESTING W REFLEX): HIV: NONREACTIVE

## 2018-05-21 ENCOUNTER — Telehealth: Payer: Self-pay | Admitting: Family

## 2018-05-21 ENCOUNTER — Other Ambulatory Visit: Payer: Self-pay | Admitting: Family

## 2018-05-21 MED ORDER — VITAMIN D (ERGOCALCIFEROL) 1.25 MG (50000 UNIT) PO CAPS: 50000.0000 [IU] | ORAL_CAPSULE | ORAL | 0 refills | Status: AC

## 2018-05-21 NOTE — Telephone Encounter (Signed)
Copied from [place] [id number]. Topic: Quick Communication - See Telephone Encounter >> May 21, 2018  3:55 PM Bea Graff, NT wrote: CRM for notification. See Telephone encounter for: 05/21/18. Pt would like a call to discuss her lab results.

## 2018-05-22 NOTE — Telephone Encounter (Signed)
Spoke with patient today and info given. 

## 2018-07-10 ENCOUNTER — Telehealth: Payer: Self-pay | Admitting: Family

## 2018-07-10 ENCOUNTER — Other Ambulatory Visit: Payer: Self-pay | Admitting: Family

## 2018-07-10 DIAGNOSIS — L821 Other seborrheic keratosis: Secondary | ICD-10-CM | POA: Diagnosis not present

## 2018-07-10 DIAGNOSIS — L738 Other specified follicular disorders: Secondary | ICD-10-CM | POA: Diagnosis not present

## 2018-07-10 DIAGNOSIS — D1801 Hemangioma of skin and subcutaneous tissue: Secondary | ICD-10-CM | POA: Diagnosis not present

## 2018-07-10 MED ORDER — HYDROCHLOROTHIAZIDE 25 MG PO TABS: 25.0000 mg | ORAL_TABLET | Freq: Every day | ORAL | 0 refills | Status: DC

## 2018-07-10 MED ORDER — ATENOLOL 50 MG PO TABS: 50.0000 mg | ORAL_TABLET | Freq: Every day | ORAL | 0 refills | Status: DC

## 2018-07-10 NOTE — Telephone Encounter (Signed)
Pt forgot their RX going to the beach   Pt would like 5 pills of each atenolol and HTZ,   Please send to  [facility]  [address] [place], [place] [address] [phone]

## 2018-08-04 DIAGNOSIS — D485 Neoplasm of uncertain behavior of skin: Secondary | ICD-10-CM | POA: Diagnosis not present

## 2018-08-04 DIAGNOSIS — D1801 Hemangioma of skin and subcutaneous tissue: Secondary | ICD-10-CM | POA: Diagnosis not present

## 2018-08-07 DIAGNOSIS — Z719 Counseling, unspecified: Secondary | ICD-10-CM | POA: Diagnosis not present

## 2018-08-10 ENCOUNTER — Other Ambulatory Visit: Payer: Self-pay | Admitting: Family

## 2018-08-10 MED ORDER — HYDROCHLOROTHIAZIDE 25 MG PO TABS: 25.0000 mg | ORAL_TABLET | Freq: Every day | ORAL | 0 refills | Status: DC

## 2018-08-10 MED ORDER — ATENOLOL 50 MG PO TABS: 50.0000 mg | ORAL_TABLET | Freq: Every day | ORAL | 0 refills | Status: DC

## 2018-08-12 DIAGNOSIS — Z719 Counseling, unspecified: Secondary | ICD-10-CM | POA: Diagnosis not present

## 2018-08-19 ENCOUNTER — Encounter: Payer: Self-pay | Admitting: Family

## 2018-08-19 ENCOUNTER — Ambulatory Visit (INDEPENDENT_AMBULATORY_CARE_PROVIDER_SITE_OTHER): Payer: 59 | Admitting: Family

## 2018-08-19 ENCOUNTER — Other Ambulatory Visit (INDEPENDENT_AMBULATORY_CARE_PROVIDER_SITE_OTHER): Payer: 59

## 2018-08-19 VITALS — BP 128/78 | HR 72 | Temp 97.6°F | Ht 67.0 in | Wt 284.0 lb

## 2018-08-19 DIAGNOSIS — I10 Essential (primary) hypertension: Secondary | ICD-10-CM | POA: Diagnosis not present

## 2018-08-19 DIAGNOSIS — Z23 Encounter for immunization: Secondary | ICD-10-CM

## 2018-08-19 DIAGNOSIS — R7303 Prediabetes: Secondary | ICD-10-CM

## 2018-08-19 DIAGNOSIS — Z719 Counseling, unspecified: Secondary | ICD-10-CM | POA: Diagnosis not present

## 2018-08-19 LAB — BASIC METABOLIC PANEL
BUN: 22 mg/dL (ref 6–23)
CALCIUM: 8.8 mg/dL (ref 8.4–10.5)
CO2: 27 mEq/L (ref 19–32)
CREATININE: 0.89 mg/dL (ref 0.40–1.20)
Chloride: 103 mEq/L (ref 96–112)
GFR: 70.93 mL/min (ref >60.00)
Glucose, Bld: 118 mg/dL — ABNORMAL HIGH (ref 70–99)
Potassium: 4.2 mEq/L (ref 3.5–5.1)
Sodium: 138 mEq/L (ref 135–145)

## 2018-08-19 LAB — HEMOGLOBIN A1C: HEMOGLOBIN A1C: 6 % (ref 4.6–6.5)

## 2018-08-19 MED ORDER — ATENOLOL 50 MG PO TABS: 50.0000 mg | ORAL_TABLET | Freq: Every day | ORAL | 1 refills | Status: DC

## 2018-08-19 MED ORDER — HYDROCHLOROTHIAZIDE 25 MG PO TABS: 25.0000 mg | ORAL_TABLET | Freq: Every day | ORAL | 1 refills | Status: DC

## 2018-08-19 NOTE — Progress Notes (Signed)
Jodi Bailey is a 52 y.o. female with the following history as recorded in EpicCare:  Patient Active Problem List   Diagnosis Date Noted  . Hypertension 02/15/2017  . Routine general medical examination at a health care facility 10/15/2016  . Otalgia of both ears 09/26/2016  . Headache(784.0) 07/13/2014  . Arrhythmia 07/13/2014  . Dizziness 07/13/2014  . Obesity (BMI 30-39.9) 03/16/2014  . Depression 08/31/2013  . LOW BACK PAIN SYNDROME 02/18/2011  . SKIN CANCER, HX OF 02/18/2011  . BACK PAIN 02/04/2011    Current Outpatient Medications  Medication Sig Dispense Refill  . acetaminophen (TYLENOL) 325 MG tablet Take 650 mg by mouth every 6 (six) hours as needed. As needed for pain/headaches    . atenolol (TENORMIN) 50 MG tablet Take 1 tablet (50 mg total) by mouth daily. 90 tablet 1  . Cholecalciferol (VITAMIN D PO) Take 1 tablet by mouth daily.    . hydrochlorothiazide (HYDRODIURIL) 25 MG tablet Take 1 tablet (25 mg total) by mouth daily. 90 tablet 1  . Multiple Vitamins-Minerals (MULTIVITAMIN PO) Take 1 tablet by mouth daily.     No current facility-administered medications for this visit.     Allergies: Codeine and Diovan [valsartan]  Past Medical History:  Diagnosis Date  . Anxiety   . Atrial fibrillation ([place])   . Chronic headaches   . Depression   . Hypertension   . Palpitations     History reviewed. No pertinent surgical history.  Family History  Problem Relation Age of Onset  . Depression Sister        situational  . Depression Son   . Suicidality Son   . Crohn's disease Mother   . Diverticulitis Mother   . Hypertension Father   . Diabetes Father   . Heart disease Father   . Hyperlipidemia Maternal Grandmother   . Hypertension Maternal Grandmother   . Osteoporosis Maternal Grandmother   . Stroke Maternal Grandfather   . Colon cancer Paternal Grandmother   . Heart disease Paternal Grandfather   . Heart attack Paternal Grandfather   . Alcohol abuse Neg  Hx     Social History   Tobacco Use  . Smoking status: Never Smoker  . Smokeless tobacco: Never Used  Substance Use Topics  . Alcohol use: Yes    Comment: Very RARELY    Subjective:  3 month follow-up on hypertension; Denies any chest pain, shortness of breath, blurred vision or headache. Has been exercising more regularly in the past 2 weeks- [facility]/ weights;  Would like to get her Hgba1c re-checked today; is concerned about progression of pre-diabetes;    Objective:  Vitals:   08/19/18 0845  BP: 128/78  Pulse: 72  Temp: 97.6 F (36.4 C)  TempSrc: Oral  SpO2: 95%  Weight: 284 lb 0.6 oz (128.8 kg)  Height: 5\' 7"  (1.702 m)    General: Well developed, well nourished, in no acute distress  Skin : Warm and dry.  Head: Normocephalic and atraumatic  Lungs: Respirations unlabored; clear to auscultation bilaterally without wheeze, rales, rhonchi  CVS exam: normal rate and regular rhythm.  Neurologic: Alert and oriented; speech intact; face symmetrical; moves all extremities well; CNII-XII intact without focal deficit   Assessment:  1. Essential hypertension   2. Pre-diabetes   3. Need for influenza vaccination     Plan:  1. Stable; continue same medications; refills updated; 2. Check CMP, hgba1c; congratulated patient on her exercise routine/ commitment to  getting healthy; 3. Flu shot given today;  Encouraged patient to see her GYN for mammogram/ pap smear; offered to manage here but she prefers to continue with GYN; colonoscopy also deferred until early next year as well.   Return in about 4 months (around 12/19/2018).  Orders Placed This Encounter  Procedures  . Flu Vaccine QUAD 36+ mos IM  . Basic Metabolic Panel (BMET)    Standing Status:   Future    Number of Occurrences:   1    Standing Expiration Date:   08/19/2019  . HgB A1c    Standing Status:   Future    Number of Occurrences:   1    Standing Expiration Date:   08/19/2019     Requested Prescriptions   Signed Prescriptions Disp Refills  . atenolol (TENORMIN) 50 MG tablet 90 tablet 1    Sig: Take 1 tablet (50 mg total) by mouth daily.  . hydrochlorothiazide (HYDRODIURIL) 25 MG tablet 90 tablet 1    Sig: Take 1 tablet (25 mg total) by mouth daily.

## 2018-08-26 DIAGNOSIS — Z719 Counseling, unspecified: Secondary | ICD-10-CM | POA: Diagnosis not present

## 2018-09-09 DIAGNOSIS — Z719 Counseling, unspecified: Secondary | ICD-10-CM | POA: Diagnosis not present

## 2018-09-16 DIAGNOSIS — Z719 Counseling, unspecified: Secondary | ICD-10-CM | POA: Diagnosis not present

## 2018-09-23 DIAGNOSIS — Z719 Counseling, unspecified: Secondary | ICD-10-CM | POA: Diagnosis not present

## 2018-12-18 ENCOUNTER — Telehealth: Payer: Self-pay | Admitting: Family

## 2018-12-18 NOTE — Telephone Encounter (Signed)
I have spoken with patient in regard to rescheduling appointment for 1/20 w/ Mickel Baas that was cancelled through the automated system.  Patient stated that she did want to reschedule but could not at this time.  States she will call back to reschedule.

## 2018-12-21 ENCOUNTER — Ambulatory Visit: Payer: 59 | Admitting: Family

## 2019-02-01 ENCOUNTER — Encounter: Payer: Self-pay | Admitting: Family

## 2019-02-01 ENCOUNTER — Other Ambulatory Visit (INDEPENDENT_AMBULATORY_CARE_PROVIDER_SITE_OTHER): Payer: 59

## 2019-02-01 ENCOUNTER — Ambulatory Visit (INDEPENDENT_AMBULATORY_CARE_PROVIDER_SITE_OTHER): Payer: 59 | Admitting: Family

## 2019-02-01 VITALS — BP 130/84 | HR 73 | Temp 99.1°F | Ht 67.0 in | Wt 280.0 lb

## 2019-02-01 DIAGNOSIS — L989 Disorder of the skin and subcutaneous tissue, unspecified: Secondary | ICD-10-CM | POA: Diagnosis not present

## 2019-02-01 DIAGNOSIS — J209 Acute bronchitis, unspecified: Secondary | ICD-10-CM

## 2019-02-01 DIAGNOSIS — R7303 Prediabetes: Secondary | ICD-10-CM

## 2019-02-01 DIAGNOSIS — J019 Acute sinusitis, unspecified: Secondary | ICD-10-CM

## 2019-02-01 LAB — COMPREHENSIVE METABOLIC PANEL
ALBUMIN: 4.3 g/dL (ref 3.5–5.2)
ALK PHOS: 83 U/L (ref 39–117)
ALT: 33 U/L (ref 0–35)
AST: 21 U/L (ref 0–37)
BUN: 20 mg/dL (ref 6–23)
CALCIUM: 9.7 mg/dL (ref 8.4–10.5)
CHLORIDE: 102 meq/L (ref 96–112)
CO2: 29 mEq/L (ref 19–32)
CREATININE: 0.98 mg/dL (ref 0.40–1.20)
GFR: 59.61 mL/min — ABNORMAL LOW (ref >60.00)
Glucose, Bld: 92 mg/dL (ref 70–99)
POTASSIUM: 4.3 meq/L (ref 3.5–5.1)
Sodium: 139 mEq/L (ref 135–145)
Total Bilirubin: 0.8 mg/dL (ref 0.2–1.2)
Total Protein: 7.7 g/dL (ref 6.0–8.3)

## 2019-02-01 LAB — HEMOGLOBIN A1C: Hgb A1c MFr Bld: 5.9 % (ref 4.6–6.5)

## 2019-02-01 MED ORDER — AMOXICILLIN-POT CLAVULANATE 875-125 MG PO TABS: 1.0000 | ORAL_TABLET | Freq: Two times a day (BID) | ORAL | 0 refills | Status: DC

## 2019-02-01 NOTE — Progress Notes (Signed)
Jodi Bailey is a 53 y.o. female with the following history as recorded in EpicCare:  Patient Active Problem List   Diagnosis Date Noted  . Hypertension 02/15/2017  . Routine general medical examination at a health care facility 10/15/2016  . Otalgia of both ears 09/26/2016  . Headache(784.0) 07/13/2014  . Arrhythmia 07/13/2014  . Dizziness 07/13/2014  . Obesity (BMI 30-39.9) 03/16/2014  . Depression 08/31/2013  . LOW BACK PAIN SYNDROME 02/18/2011  . SKIN CANCER, HX OF 02/18/2011  . BACK PAIN 02/04/2011    Current Outpatient Medications  Medication Sig Dispense Refill  . acetaminophen (TYLENOL) 325 MG tablet Take 650 mg by mouth every 6 (six) hours as needed. As needed for pain/headaches    . atenolol (TENORMIN) 50 MG tablet Take 1 tablet (50 mg total) by mouth daily. 90 tablet 1  . Cholecalciferol (VITAMIN D PO) Take 1 tablet by mouth daily.    . hydrochlorothiazide (HYDRODIURIL) 25 MG tablet Take 1 tablet (25 mg total) by mouth daily. 90 tablet 1  . Multiple Vitamins-Minerals (MULTIVITAMIN PO) Take 1 tablet by mouth daily.     No current facility-administered medications for this visit.     Allergies: Codeine and Diovan [valsartan]  Past Medical History:  Diagnosis Date  . Anxiety   . Atrial fibrillation ([place])   . Chronic headaches   . Depression   . Hypertension   . Palpitations     History reviewed. No pertinent surgical history.  Family History  Problem Relation Age of Onset  . Depression Sister        situational  . Depression Son   . Suicidality Son   . Crohn's disease Mother   . Diverticulitis Mother   . Hypertension Father   . Diabetes Father   . Heart disease Father   . Hyperlipidemia Maternal Grandmother   . Hypertension Maternal Grandmother   . Osteoporosis Maternal Grandmother   . Stroke Maternal Grandfather   . Colon cancer Paternal Grandmother   . Heart disease Paternal Grandfather   . Heart attack Paternal Grandfather   . Alcohol abuse Neg  Hx     Social History   Tobacco Use  . Smoking status: Never Smoker  . Smokeless tobacco: Never Used  Substance Use Topics  . Alcohol use: Yes    Comment: Very RARELY    Subjective:  3 week history of sinus pain/ pressure; became concerned when she developed flu- like symptoms yesterday- body aches, fatigue, high fever; notes that son had similar symptoms in the past week; would like to make sure she does not have the flu since her fever got to almost 102 yesterday;  Requesting referral to dermatology- rapidly changing mole on her neck/ now bleeding;  Would also like to get her labs updated to make sure blood sugar is stable;     Objective:  Vitals:   02/01/19 1454  BP: 130/84  Pulse: 73  Temp: 99.1 F (37.3 C)  TempSrc: Oral  SpO2: 96%  Weight: 280 lb 0.3 oz (127 kg)  Height: '5\' 7"'$  (1.702 m)    General: Well developed, well nourished, in no acute distress  Skin : Warm and dry.  Head: Normocephalic and atraumatic  Eyes: Sclera and conjunctiva clear; pupils round and reactive to light; extraocular movements intact  Ears: External normal; canals clear; left tympanic membranes erythematous Oropharynx: Pink, supple. No suspicious lesions  Neck: Supple without thyromegaly, adenopathy  Lungs: Respirations unlabored; clear to auscultation bilaterally without wheeze, rales, rhonchi  CVS  exam: normal rate and regular rhythm.  Abdomen: Soft; nontender; nondistended; normoactive bowel sounds; no masses or hepatosplenomegaly  Musculoskeletal: No deformities; no active joint inflammation  Extremities: No edema, cyanosis, clubbing  Vessels: Symmetric bilaterally  Neurologic: Alert and oriented; speech intact; face symmetrical; moves all extremities well; CNII-XII intact without focal deficit   Assessment:  1. Acute sinusitis, recurrence not specified, unspecified location   2. Acute bronchitis, unspecified organism   3. Changing skin lesion   4. Pre-diabetes     Plan:  1. & 2.  Rx for Augmentin 875 mg bid x 10 days; increase fluids, rest; rapid flu test was negative; 3. Referral to dermatology as requested; 4. Update labs today; encouraged exercise, weight loss, healthy eating;   No follow-ups on file.  Orders Placed This Encounter  Procedures  . Comp Met (CMET)    Standing Status:   Future    Standing Expiration Date:   02/01/2020  . HgB A1c    Standing Status:   Future    Standing Expiration Date:   02/01/2020  . Ambulatory referral to Dermatology    Referral Priority:   Routine    Referral Type:   Consultation    Referral Reason:   Specialty Services Required    Requested Specialty:   Dermatology    Number of Visits Requested:   1    Requested Prescriptions    No prescriptions requested or ordered in this encounter

## 2019-02-12 ENCOUNTER — Encounter: Payer: Self-pay | Admitting: Family

## 2019-02-12 ENCOUNTER — Other Ambulatory Visit: Payer: Self-pay | Admitting: Family

## 2019-02-12 MED ORDER — PREDNISONE 20 MG PO TABS: 20.0000 mg | ORAL_TABLET | Freq: Every day | ORAL | 0 refills | Status: DC

## 2019-02-12 MED ORDER — AMOXICILLIN-POT CLAVULANATE 875-125 MG PO TABS: 1.0000 | ORAL_TABLET | Freq: Two times a day (BID) | ORAL | 0 refills | Status: DC

## 2019-04-20 ENCOUNTER — Other Ambulatory Visit: Payer: Self-pay | Admitting: Dermatology

## 2019-04-20 DIAGNOSIS — D485 Neoplasm of uncertain behavior of skin: Secondary | ICD-10-CM | POA: Diagnosis not present

## 2019-04-20 DIAGNOSIS — D229 Melanocytic nevi, unspecified: Secondary | ICD-10-CM | POA: Diagnosis not present

## 2019-06-07 ENCOUNTER — Ambulatory Visit (INDEPENDENT_AMBULATORY_CARE_PROVIDER_SITE_OTHER): Payer: 59 | Admitting: Family

## 2019-06-07 ENCOUNTER — Encounter: Payer: Self-pay | Admitting: Family

## 2019-06-07 ENCOUNTER — Other Ambulatory Visit (INDEPENDENT_AMBULATORY_CARE_PROVIDER_SITE_OTHER): Payer: 59

## 2019-06-07 ENCOUNTER — Other Ambulatory Visit: Payer: Self-pay

## 2019-06-07 VITALS — BP 130/82 | HR 71 | Temp 97.8°F | Ht 67.0 in | Wt 272.2 lb

## 2019-06-07 DIAGNOSIS — R5383 Other fatigue: Secondary | ICD-10-CM | POA: Diagnosis not present

## 2019-06-07 DIAGNOSIS — R7303 Prediabetes: Secondary | ICD-10-CM

## 2019-06-07 DIAGNOSIS — R600 Localized edema: Secondary | ICD-10-CM | POA: Diagnosis not present

## 2019-06-07 DIAGNOSIS — I10 Essential (primary) hypertension: Secondary | ICD-10-CM

## 2019-06-07 LAB — COMPREHENSIVE METABOLIC PANEL
ALT: 26 U/L (ref 0–35)
AST: 21 U/L (ref 0–37)
Albumin: 4.2 g/dL (ref 3.5–5.2)
Alkaline Phosphatase: 70 U/L (ref 39–117)
BUN: 20 mg/dL (ref 6–23)
CO2: 28 mEq/L (ref 19–32)
Calcium: 9.5 mg/dL (ref 8.4–10.5)
Chloride: 102 mEq/L (ref 96–112)
Creatinine, Ser: 0.95 mg/dL (ref 0.40–1.20)
GFR: 61.7 mL/min (ref >60.00)
Glucose, Bld: 97 mg/dL (ref 70–99)
Potassium: 4.1 mEq/L (ref 3.5–5.1)
Sodium: 139 mEq/L (ref 135–145)
Total Bilirubin: 1.1 mg/dL (ref 0.2–1.2)
Total Protein: 7.4 g/dL (ref 6.0–8.3)

## 2019-06-07 LAB — CBC WITH DIFFERENTIAL/PLATELET
Basophils Absolute: 0.1 10*3/uL (ref 0.0–0.1)
Basophils Relative: 0.7 % (ref 0.0–3.0)
Eosinophils Absolute: 0.1 10*3/uL (ref 0.0–0.7)
Eosinophils Relative: 1.8 % (ref 0.0–5.0)
HCT: 46.3 % — ABNORMAL HIGH (ref 36.0–46.0)
Hemoglobin: 15.4 g/dL — ABNORMAL HIGH (ref 12.0–15.0)
Lymphocytes Relative: 31 % (ref 12.0–46.0)
Lymphs Abs: 2.5 10*3/uL (ref 0.7–4.0)
MCHC: 33.3 g/dL (ref 30.0–36.0)
MCV: 91.1 fl (ref 78.0–100.0)
Monocytes Absolute: 0.5 10*3/uL (ref 0.1–1.0)
Monocytes Relative: 6.2 % (ref 3.0–12.0)
Neutro Abs: 4.9 10*3/uL (ref 1.4–7.7)
Neutrophils Relative %: 60.3 % (ref 43.0–77.0)
Platelets: 256 10*3/uL (ref 150.0–400.0)
RBC: 5.08 Mil/uL (ref 3.87–5.11)
RDW: 14.4 % (ref 11.5–15.5)
WBC: 8.1 10*3/uL (ref 4.0–10.5)

## 2019-06-07 LAB — HEMOGLOBIN A1C: Hgb A1c MFr Bld: 5.9 % (ref 4.6–6.5)

## 2019-06-07 LAB — BRAIN NATRIURETIC PEPTIDE: Pro B Natriuretic peptide (BNP): 46 pg/mL (ref 0.0–100.0)

## 2019-06-07 LAB — TSH: TSH: 0.79 u[IU]/mL (ref 0.35–4.50)

## 2019-06-07 LAB — VITAMIN B12: Vitamin B-12: 275 pg/mL (ref 211–911)

## 2019-06-07 NOTE — Progress Notes (Signed)
Jodi Bailey is a 53 y.o. female with the following history as recorded in EpicCare:  Patient Active Problem List   Diagnosis Date Noted  . Hypertension 02/15/2017  . Routine general medical examination at a health care facility 10/15/2016  . Otalgia of both ears 09/26/2016  . Headache(784.0) 07/13/2014  . Arrhythmia 07/13/2014  . Dizziness 07/13/2014  . Obesity (BMI 30-39.9) 03/16/2014  . Depression 08/31/2013  . LOW BACK PAIN SYNDROME 02/18/2011  . SKIN CANCER, HX OF 02/18/2011  . BACK PAIN 02/04/2011    Current Outpatient Medications  Medication Sig Dispense Refill  . acetaminophen (TYLENOL) 325 MG tablet Take 650 mg by mouth every 6 (six) hours as needed. As needed for pain/headaches    . atenolol (TENORMIN) 50 MG tablet Take 1 tablet (50 mg total) by mouth daily. 90 tablet 1  . Cholecalciferol (VITAMIN D PO) Take 1 tablet by mouth daily.    . hydrochlorothiazide (HYDRODIURIL) 25 MG tablet Take 1 tablet (25 mg total) by mouth daily. 90 tablet 1  . Multiple Vitamins-Minerals (MULTIVITAMIN PO) Take 1 tablet by mouth daily.     No current facility-administered medications for this visit.     Allergies: Codeine, Diovan [valsartan], Lisinopril, and Sulfa antibiotics  Past Medical History:  Diagnosis Date  . Anxiety   . Atrial fibrillation ([place])   . Chronic headaches   . Depression   . Hypertension   . Palpitations     History reviewed. No pertinent surgical history.  Family History  Problem Relation Age of Onset  . Depression Sister        situational  . Depression Son   . Suicidality Son   . Crohn's disease Mother   . Diverticulitis Mother   . Hypertension Father   . Diabetes Father   . Heart disease Father   . Hyperlipidemia Maternal Grandmother   . Hypertension Maternal Grandmother   . Osteoporosis Maternal Grandmother   . Stroke Maternal Grandfather   . Colon cancer Paternal Grandmother   . Heart disease Paternal Grandfather   . Heart attack Paternal  Grandfather   . Alcohol abuse Neg Hx     Social History   Tobacco Use  . Smoking status: Never Smoker  . Smokeless tobacco: Never Used  Substance Use Topics  . Alcohol use: Yes    Comment: Very RARELY    Subjective:  Follow-up on hypertension; has been exercising at least 5-6x per week and is frustrated that she has only lost 8 pounds in 3 months; is concerned that her blood pressure medications need to be changed- has also been having increased swelling in her ankles recently; Denies any chest pain, shortness of breath, blurred vision or headache; no sensation of irregular heart beat;  Is trying to make healthier food choices- eating yogurt for breakfast; admits that salad dressings may have increased amounts of salt;     Objective:  Vitals:   06/07/19 1353  BP: 130/82  Pulse: 71  Temp: 97.8 F (36.6 C)  TempSrc: Oral  SpO2: 97%  Weight: 272 lb 3.2 oz (123.5 kg)  Height: '5\' 7"'$  (1.702 m)    General: Well developed, well nourished, in no acute distress  Skin : Warm and dry.  Head: Normocephalic and atraumatic  Eyes: Sclera and conjunctiva clear; pupils round and reactive to light; extraocular movements intact  Ears: External normal; canals clear; tympanic membranes normal  Oropharynx: Pink, supple. No suspicious lesions  Neck: Supple without thyromegaly, adenopathy  Lungs: Respirations unlabored; clear to  auscultation bilaterally without wheeze, rales, rhonchi  CVS exam: normal rate and regular rhythm.  Extremities: No edema, cyanosis, clubbing  Vessels: Symmetric bilaterally  Neurologic: Alert and oriented; speech intact; face symmetrical; moves all extremities well; CNII-XII intact without focal deficit   Assessment:  1. Pedal edema   2. Essential hypertension   3. Other fatigue   4. Pre-diabetes     Plan:  Will update labs today- need to consider changing medications; ? If beta blocker limiting ability to lose weight but need to get it clarified if this is being  used for A.Fib management- past records are not definitive; may also want to consider trial of Metformin; follow-up to be determined.   No follow-ups on file.  Orders Placed This Encounter  Procedures  . TSH    Standing Status:   Future    Number of Occurrences:   1    Standing Expiration Date:   06/06/2020  . B Nat Peptide    Standing Status:   Future    Number of Occurrences:   1    Standing Expiration Date:   06/06/2020  . B12    Standing Status:   Future    Number of Occurrences:   1    Standing Expiration Date:   06/06/2020  . CBC w/Diff    Standing Status:   Future    Number of Occurrences:   1    Standing Expiration Date:   06/06/2020  . Comp Met (CMET)    Standing Status:   Future    Number of Occurrences:   1    Standing Expiration Date:   06/06/2020  . HgB A1c    Standing Status:   Future    Number of Occurrences:   1    Standing Expiration Date:   06/06/2020    Requested Prescriptions    No prescriptions requested or ordered in this encounter

## 2019-06-08 ENCOUNTER — Other Ambulatory Visit: Payer: Self-pay | Admitting: Family

## 2019-06-08 DIAGNOSIS — I4891 Unspecified atrial fibrillation: Secondary | ICD-10-CM

## 2019-06-08 MED ORDER — SAXENDA 18 MG/3ML ~~LOC~~ SOPN: PEN_INJECTOR | SUBCUTANEOUS | 1 refills | Status: AC

## 2019-06-08 MED ORDER — FUROSEMIDE 20 MG PO TABS: 20.0000 mg | ORAL_TABLET | Freq: Every day | ORAL | 3 refills | Status: DC

## 2019-06-08 MED ORDER — POTASSIUM CHLORIDE CRYS ER 20 MEQ PO TBCR: 20.0000 meq | EXTENDED_RELEASE_TABLET | Freq: Every day | ORAL | 3 refills | Status: DC

## 2019-06-15 ENCOUNTER — Other Ambulatory Visit: Payer: Self-pay | Admitting: Family

## 2019-06-18 ENCOUNTER — Ambulatory Visit (INDEPENDENT_AMBULATORY_CARE_PROVIDER_SITE_OTHER): Payer: 59 | Admitting: Cardiology

## 2019-06-18 ENCOUNTER — Other Ambulatory Visit: Payer: Self-pay

## 2019-06-18 ENCOUNTER — Encounter: Payer: Self-pay | Admitting: Cardiology

## 2019-06-18 VITALS — BP 122/68 | HR 58 | Ht 67.0 in | Wt 270.0 lb

## 2019-06-18 DIAGNOSIS — R011 Cardiac murmur, unspecified: Secondary | ICD-10-CM | POA: Diagnosis not present

## 2019-06-18 DIAGNOSIS — I480 Paroxysmal atrial fibrillation: Secondary | ICD-10-CM | POA: Diagnosis not present

## 2019-06-18 MED ORDER — ATENOLOL 50 MG PO TABS: ORAL_TABLET | ORAL | 1 refills | Status: DC

## 2019-06-18 NOTE — Progress Notes (Signed)
Cardiology Office Note:    Date:  06/18/2019   ID:  Jodi Bailey, DOB 03/29/1966, MRN [id number]  PCP:  Jodi Salvage, FNP  Cardiologist:  Jodi Lindau, MD   Referring MD: Jodi Bailey,*    ASSESSMENT:    1. Paroxysmal atrial fibrillation ([facility])   2. Morbid obesity ([place])    PLAN:    In order of problems listed above:  1. Paroxysmal atrial fibrillation: I discussed this with her at extensive length.  This happened when she had very stressful moments of life.  This happened about 20 years ago and subsequently has not recurred.  She has 2 get of the atenolol and she is trying her best to do it.  She has no history of hypertension.  I have asked her to cut down her atenolol to half tablet daily for a week and then subsequently to discontinue it.  She is on diuretic and potassium supplementation by her primary care doctor for pedal edema.  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  In the morning when she wakes up she has no pedal edema.  My evaluation today does not reveal pedal edema.  She develops this at the end of the day.  I discussed with her several maneuvers and calf compression measures to counter this.  Compression stocking issue was also discussed. 2. Morbid obesity: Diet was discussed and she vocalized understanding.  Importance of regular exercise stressed.  Echocardiogram will be done to assess murmur heard on auscultation. 3. Patient will be seen in follow-up appointment in 4 months or earlier if the patient has any concerns    Medication Adjustments/Labs and Tests Ordered: Current medicines are reviewed at length with the patient today.  Concerns regarding medicines are outlined above.  No orders of the defined types were placed in this encounter.  No orders of the defined types were placed in this encounter.    History of Present Illness:    Jodi Bailey is a 53 y.o. female who is being seen today for the evaluation of history of  paroxysmal atrial fibrillation at the request of Jodi Bailey,*.  Patient is a pleasant 53 year old female.  She mentions to me that earlier in life when she was in her 37s she underwent tremendous amount of stress and subsequently a divorce.  At that time she had a brief episodes of atrial fibrillation which have never occurred since.  She wants to go off the atenolol.  She wants to get into a better lifestyle.  She walks on a regular basis at least half an hour a day without any problems.  No chest pain orthopnea or PND.  No palpitations.  At the time of my evaluation, the patient is alert awake oriented and in no distress.  Past Medical History:  Diagnosis Date  . Anxiety   . Atrial fibrillation ([place])   . Chronic headaches   . Depression   . Hypertension   . Palpitations     History reviewed. No pertinent surgical history.  Current Medications: Current Meds  Medication Sig  . acetaminophen (TYLENOL) 325 MG tablet Take 650 mg by mouth every 6 (six) hours as needed. As needed for pain/headaches  . atenolol (TENORMIN) 50 MG tablet TAKE 1 TABLET BY MOUTH EVERY DAY  . Cholecalciferol (VITAMIN D PO) Take 1 tablet by mouth daily.  . furosemide (LASIX) 20 MG tablet Take 1 tablet (20 mg total) by mouth daily.  . Multiple Vitamins-Minerals (MULTIVITAMIN  PO) Take 1 tablet by mouth daily.  . potassium chloride SA (K-DUR) 20 MEQ tablet Take 1 tablet (20 mEq total) by mouth daily.     Allergies:   Codeine, Diovan [valsartan], Lisinopril, and Sulfa antibiotics   Social History   Socioeconomic History  . Marital status: Married    Spouse name: Not on file  . Number of children: 3  . Years of education: 17  . Highest education level: Not on file  Occupational History  . Occupation: Part time employment  Social Needs  . Financial resource strain: Not on file  . Food insecurity    Worry: Not on file    Inability: Not on file  . Transportation needs    Medical: Not on file     Non-medical: Not on file  Tobacco Use  . Smoking status: Never Smoker  . Smokeless tobacco: Never Used  Substance and Sexual Activity  . Alcohol use: Yes    Comment: Very RARELY  . Drug use: No  . Sexual activity: Not on file  Lifestyle  . Physical activity    Days per week: Not on file    Minutes per session: Not on file  . Stress: Not on file  Relationships  . Social Herbalist on phone: Not on file    Gets together: Not on file    Attends religious service: Not on file    Active member of club or organization: Not on file    Attends meetings of clubs or organizations: Not on file    Relationship status: Not on file  Other Topics Concern  . Not on file  Social History Narrative   Fun: [place] [employer] out with family. Anything outdoors.    Denies abuse and feels safe at home.      Family History: The patient's family history includes Colon cancer in her paternal grandmother; Crohn's disease in her mother; Depression in her sister and son; Diabetes in her father; Diverticulitis in her mother; Heart attack in her paternal grandfather; Heart disease in her father and paternal grandfather; Hyperlipidemia in her maternal grandmother; Hypertension in her father and maternal grandmother; Osteoporosis in her maternal grandmother; Stroke in her maternal grandfather; Suicidality in her son. There is no history of Alcohol abuse.  ROS:   Please see the history of present illness.    All other systems reviewed and are negative.  EKGs/Labs/Other Studies Reviewed:    The following studies were reviewed today: EKG reveals sinus rhythm and nonspecific ST-T changes.   Recent Labs: 06/07/2019: ALT 26; BUN 20; Creatinine, Ser 0.95; Hemoglobin 15.4; Platelets 256.0; Potassium 4.1; Pro B Natriuretic peptide (BNP) 46.0; Sodium 139; TSH 0.79  Recent Lipid Panel    Component Value Date/Time   CHOL 210 (H) 05/19/2018 0934   TRIG 190.0 (H) 05/19/2018 0934   HDL 40.20 05/19/2018 0934   CHOLHDL 5  05/19/2018 0934   VLDL 38.0 05/19/2018 0934   LDLCALC 132 (H) 05/19/2018 0934    Physical Exam:    VS:  BP 122/68 (BP Location: Left Arm, Patient Position: Sitting, Cuff Size: Normal)   Pulse (!) 58   Ht 5\' 7"  (1.702 m)   Wt 270 lb (122.5 kg)   SpO2 97%   BMI 42.29 kg/m     Wt Readings from Last 3 Encounters:  06/18/19 270 lb (122.5 kg)  06/07/19 272 lb 3.2 oz (123.5 kg)  02/01/19 280 lb 0.3 oz (127 kg)     GEN: Patient is in  no acute distress HEENT: Normal NECK: No JVD; No carotid bruits LYMPHATICS: No lymphadenopathy CARDIAC: S1 S2 regular, 2/6 systolic murmur at the apex. RESPIRATORY:  Clear to auscultation without rales, wheezing or rhonchi  ABDOMEN: Soft, non-tender, non-distended MUSCULOSKELETAL:  No edema; No deformity  SKIN: Warm and dry NEUROLOGIC:  Alert and oriented x 3 PSYCHIATRIC:  Normal affect    Signed, Jodi Lindau, MD  06/18/2019 9:03 AM    [place]

## 2019-06-18 NOTE — Patient Instructions (Addendum)
Medication Instructions:     If you need a refill on your cardiac medications before your next appointment, please call your pharmacy.   Lab work: NONE If you have labs (blood work) drawn today and your tests are completely normal, you will receive your results only by: Marland Kitchen MyChart Message (if you have MyChart) OR . A paper copy in the mail If you have any lab test that is abnormal or we need to change your treatment, we will call you to review the results.  Testing/Procedures: You had an EKG performed today.   Your physician has requested that you have an echocardiogram. Echocardiography is a painless test that uses sound waves to create images of your heart. It provides your doctor with information about the size and shape of your heart and how well your heart's chambers and valves are working. This procedure takes approximately one hour. There are no restrictions for this procedure.    Follow-Up: At [facility], you and your health needs are our priority.  As part of our continuing mission to provide you with exceptional heart care, we have created designated Provider Care Teams.  These Care Teams include your primary Cardiologist (physician) and Advanced Practice Providers (APPs -  Physician Assistants and Nurse Practitioners) who all work together to provide you with the care you need, when you need it. You will need a follow up appointment in 4 months.    Any Other Special Instructions Will Be Listed Below   Echocardiogram An echocardiogram is a procedure that uses painless sound waves (ultrasound) to produce an image of the heart. Images from an echocardiogram can provide important information about:  Signs of coronary artery disease (CAD).  Aneurysm detection. An aneurysm is a weak or damaged part of an artery wall that bulges out from the normal force of blood pumping through the body.  Heart size and shape. Changes in the size or shape of the heart can be associated with  certain conditions, including heart failure, aneurysm, and CAD.  Heart muscle function.  Heart valve function.  Signs of a past heart attack.  Fluid buildup around the heart.  Thickening of the heart muscle.  A tumor or infectious growth around the heart valves. Tell a health care provider about:  Any allergies you have.  All medicines you are taking, including vitamins, herbs, eye drops, creams, and over-the-counter medicines.  Any blood disorders you have.  Any surgeries you have had.  Any medical conditions you have.  Whether you are pregnant or may be pregnant. What are the risks? Generally, this is a safe procedure. However, problems may occur, including:  Allergic reaction to dye (contrast) that may be used during the procedure. What happens before the procedure? No specific preparation is needed. You may eat and drink normally. What happens during the procedure?   An IV tube may be inserted into one of your veins.  You may receive contrast through this tube. A contrast is an injection that improves the quality of the pictures from your heart.  A gel will be applied to your chest.  A wand-like tool (transducer) will be moved over your chest. The gel will help to transmit the sound waves from the transducer.  The sound waves will harmlessly bounce off of your heart to allow the heart images to be captured in real-time motion. The images will be recorded on a computer. The procedure may vary among health care providers and hospitals. What happens after the procedure?  You may  return to your normal, everyday life, including diet, activities, and medicines, unless your health care provider tells you not to do that. Summary  An echocardiogram is a procedure that uses painless sound waves (ultrasound) to produce an image of the heart.  Images from an echocardiogram can provide important information about the size and shape of your heart, heart muscle function,  heart valve function, and fluid buildup around your heart.  You do not need to do anything to prepare before this procedure. You may eat and drink normally.  After the echocardiogram is completed, you may return to your normal, everyday life, unless your health care provider tells you not to do that. This information is not intended to replace advice given to you by your health care provider. Make sure you discuss any questions you have with your health care provider. Document Released: 11/15/2000 Document Revised: 03/11/2019 Document Reviewed: 12/21/2016 Elsevier Patient Education  2020 [employer].

## 2019-06-18 NOTE — Addendum Note (Signed)
Addended by: Beckey Rutter on: 06/18/2019 09:19 AM   Modules accepted: Orders

## 2019-06-18 NOTE — Addendum Note (Signed)
Addended by: Beckey Rutter on: 06/18/2019 09:24 AM   Modules accepted: Orders

## 2019-06-22 ENCOUNTER — Ambulatory Visit (HOSPITAL_BASED_OUTPATIENT_CLINIC_OR_DEPARTMENT_OTHER): Payer: 59

## 2019-06-30 ENCOUNTER — Ambulatory Visit (INDEPENDENT_AMBULATORY_CARE_PROVIDER_SITE_OTHER): Payer: 59 | Admitting: Family

## 2019-06-30 ENCOUNTER — Ambulatory Visit: Payer: Self-pay | Admitting: Family

## 2019-06-30 ENCOUNTER — Encounter: Payer: Self-pay | Admitting: Family

## 2019-06-30 DIAGNOSIS — A059 Bacterial foodborne intoxication, unspecified: Secondary | ICD-10-CM

## 2019-06-30 DIAGNOSIS — R112 Nausea with vomiting, unspecified: Secondary | ICD-10-CM | POA: Diagnosis not present

## 2019-06-30 MED ORDER — ONDANSETRON 8 MG PO TBDP: 8.0000 mg | ORAL_TABLET | Freq: Three times a day (TID) | ORAL | 0 refills | Status: DC | PRN

## 2019-06-30 NOTE — Telephone Encounter (Signed)
Pt. Reports she started having vomiting and diarrhea yesterday. Thinks she has food poisoning. Has vomited 6-7 times in 24 hours. Runs a fever at times - 101. Warm transfer to Sam in the practice for a visit.  Answer Assessment - Initial Assessment Questions 1. VOMITING SEVERITY: "How many times have you vomited in the past 24 hours?"     - MILD:  1 - 2 times/day    - MODERATE: 3 - 5 times/day, decreased oral intake without significant weight loss or symptoms of dehydration    - SEVERE: 6 or more times/day, vomits everything or nearly everything, with significant weight loss, symptoms of dehydration      6-7 2. ONSET: "When did the vomiting begin?"      Yesterday 3. FLUIDS: "What fluids or food have you vomited up today?" "Have you been able to keep any fluids down?"     Gatorade 4. ABDOMINAL PAIN: "Are your having any abdominal pain?" If yes : "How bad is it and what does it feel like?" (e.g., crampy, dull, intermittent, constant)      Yes - cramping 5. DIARRHEA: "Is there any diarrhea?" If so, ask: "How many times today?"      Yes 6. CONTACTS: "Is there anyone else in the family with the same symptoms?"      No 7. CAUSE: "What do you think is causing your vomiting?"     Maybe food poisoning 8. HYDRATION STATUS: "Any signs of dehydration?" (e.g., dry mouth [not only dry lips], too weak to stand) "When did you last urinate?"     No dehydration 9. OTHER SYMPTOMS: "Do you have any other symptoms?" (e.g., fever, headache, vertigo, vomiting blood or coffee grounds, recent head injury)     Chills, 101 temp. At times 10. PREGNANCY: "Is there any chance you are pregnant?" "When was your last menstrual period?"       No  Protocols used: [facility]

## 2019-06-30 NOTE — Progress Notes (Signed)
Jodi Bailey is a 53 y.o. female with the following history as recorded in EpicCare:  Patient Active Problem List   Diagnosis Date Noted  . Paroxysmal atrial fibrillation ([place]) 06/18/2019  . Morbid obesity ([place]) 06/18/2019  . Hypertension 02/15/2017  . Headache 01/27/2017  . Bilateral swelling of feet and ankles 01/02/2017  . Routine general medical examination at a health care facility 10/15/2016  . Otalgia of both ears 09/26/2016  . Headache(784.0) 07/13/2014  . Arrhythmia 07/13/2014  . Dizziness 07/13/2014  . Obesity (BMI 30-39.9) 03/16/2014  . Depression 08/31/2013  . LOW BACK PAIN SYNDROME 02/18/2011  . SKIN CANCER, HX OF 02/18/2011  . BACK PAIN 02/04/2011    Current Outpatient Medications  Medication Sig Dispense Refill  . acetaminophen (TYLENOL) 325 MG tablet Take 650 mg by mouth every 6 (six) hours as needed. As needed for pain/headaches    . atenolol (TENORMIN) 50 MG tablet Patient was instructed to decrease to 25 mg and discontinue on 06/25/19 per Dr. Docia Furl 90 tablet 1  . Cholecalciferol (VITAMIN D PO) Take 1 tablet by mouth daily.    . furosemide (LASIX) 20 MG tablet Take 1 tablet (20 mg total) by mouth daily. 30 tablet 3  . Liraglutide -Weight Management (SAXENDA) 18 MG/3ML SOPN Inject 0.6 mg into the skin daily for 7 days, THEN 1.2 mg daily for 7 days, THEN 1.8 mg daily for 7 days, THEN 2.4 mg daily for 7 days. (Patient not taking: Reported on 06/18/2019) 3 mL 1  . Multiple Vitamins-Minerals (MULTIVITAMIN PO) Take 1 tablet by mouth daily.    . ondansetron (ZOFRAN ODT) 8 MG disintegrating tablet Take 1 tablet (8 mg total) by mouth every 8 (eight) hours as needed for nausea or vomiting. 20 tablet 0  . potassium chloride SA (K-DUR) 20 MEQ tablet Take 1 tablet (20 mEq total) by mouth daily. 30 tablet 3   No current facility-administered medications for this visit.     Allergies: Codeine, Diovan [valsartan], Lisinopril, and Sulfa antibiotics  Past Medical History:   Diagnosis Date  . Anxiety   . Atrial fibrillation ([place])   . Chronic headaches   . Depression   . Hypertension   . Palpitations     No past surgical history on file.  Family History  Problem Relation Age of Onset  . Depression Sister        situational  . Depression Son   . Suicidality Son   . Crohn's disease Mother   . Diverticulitis Mother   . Hypertension Father   . Diabetes Father   . Heart disease Father   . Hyperlipidemia Maternal Grandmother   . Hypertension Maternal Grandmother   . Osteoporosis Maternal Grandmother   . Stroke Maternal Grandfather   . Colon cancer Paternal Grandmother   . Heart disease Paternal Grandfather   . Heart attack Paternal Grandfather   . Alcohol abuse Neg Hx     Social History   Tobacco Use  . Smoking status: Never Smoker  . Smokeless tobacco: Never Used  Substance Use Topics  . Alcohol use: Yes    Comment: Very RARELY    Subjective:    I connected with Pamala Hurry A Bernabei on 06/30/19 at 10:00 AM EDT by a video enabled telemedicine application and verified that I am speaking with the correct person using two identifiers. Patient and I are the only 2 people on the video call.    I discussed the limitations of evaluation and management by telemedicine and the availability  of in person appointments. The patient expressed understanding and agreed to proceed.   Is suspicious for food poisoning- ate hard boiled egg yesterday morning and by late morning, started having diarrhea/ abdominal cramping; went home from work and was running a fever of 101.6; notes that had explosive diarrhea almost immediately upon getting home and fever seemed to break; did vomit some yesterday but this seems to be improving; notes that symptoms do seem improved today but still notes her stomach is not right; did see bright red blood in the toilet with last episode of diarrhea;      Objective:  There were no vitals filed for this visit.  General: Well  developed, well nourished, in no acute distress  Head: Normocephalic and atraumatic  Lungs: Respirations unlabored;  Neurologic: Alert and oriented; speech intact; face symmetrical;   Assessment:  1. Food poisoning   2. Nausea and vomiting, intractability of vomiting not specified, unspecified vomiting type     Plan:  Symptoms do appear to be improving; BRAT diet discussed; recommend Gatorade, Ginger Ale; Rx for Zofran 8 mg q 8 hours prn; reassurance that blood is secondary to diarrhea and should resolve- follow-up if persists; will not plan to return to work before Friday; follow-up worse, no better.   No follow-ups on file.  No orders of the defined types were placed in this encounter.   Requested Prescriptions   Signed Prescriptions Disp Refills  . ondansetron (ZOFRAN ODT) 8 MG disintegrating tablet 20 tablet 0    Sig: Take 1 tablet (8 mg total) by mouth every 8 (eight) hours as needed for nausea or vomiting.

## 2019-08-31 ENCOUNTER — Encounter: Payer: Self-pay | Admitting: Family

## 2019-09-01 ENCOUNTER — Other Ambulatory Visit: Payer: Self-pay | Admitting: Family

## 2019-09-01 MED ORDER — FUROSEMIDE 20 MG PO TABS: 20.0000 mg | ORAL_TABLET | Freq: Two times a day (BID) | ORAL | 0 refills | Status: DC | PRN

## 2019-10-19 ENCOUNTER — Ambulatory Visit: Payer: 59 | Admitting: Cardiology

## 2019-12-02 ENCOUNTER — Other Ambulatory Visit: Payer: Self-pay | Admitting: Family

## 2020-02-28 ENCOUNTER — Encounter: Payer: Self-pay | Admitting: Family

## 2020-02-29 ENCOUNTER — Telehealth: Payer: Self-pay

## 2020-02-29 NOTE — Telephone Encounter (Signed)
90 days supply with 3 refills   1.Medication Requested:furosemide (LASIX) 20 MG tablet  2. Pharmacy ([place]):[id number] - [place], [address]  3. On Med List: Yes   4. Last Visit with PCP: 6.4.2019   5. Next visit date with PCP: 5.3.2021   Agent: Please be advised that RX refills may take up to 3 business days. We ask that you follow-up with your pharmacy.

## 2020-03-01 ENCOUNTER — Other Ambulatory Visit: Payer: Self-pay | Admitting: Family

## 2020-03-01 MED ORDER — FUROSEMIDE 20 MG PO TABS: 20.0000 mg | ORAL_TABLET | Freq: Two times a day (BID) | ORAL | 0 refills | Status: DC | PRN

## 2020-04-03 ENCOUNTER — Ambulatory Visit (INDEPENDENT_AMBULATORY_CARE_PROVIDER_SITE_OTHER): Payer: 59 | Admitting: Family

## 2020-04-03 ENCOUNTER — Other Ambulatory Visit: Payer: Self-pay

## 2020-04-03 ENCOUNTER — Ambulatory Visit (INDEPENDENT_AMBULATORY_CARE_PROVIDER_SITE_OTHER): Payer: 59

## 2020-04-03 VITALS — BP 146/90 | HR 70 | Temp 98.4°F | Ht 67.0 in | Wt 262.0 lb

## 2020-04-03 DIAGNOSIS — R600 Localized edema: Secondary | ICD-10-CM | POA: Diagnosis not present

## 2020-04-03 DIAGNOSIS — Z0000 Encounter for general adult medical examination without abnormal findings: Secondary | ICD-10-CM | POA: Diagnosis not present

## 2020-04-03 DIAGNOSIS — Z13220 Encounter for screening for lipoid disorders: Secondary | ICD-10-CM | POA: Diagnosis not present

## 2020-04-03 DIAGNOSIS — M25512 Pain in left shoulder: Secondary | ICD-10-CM

## 2020-04-03 LAB — LIPID PANEL
Cholesterol: 218 mg/dL — ABNORMAL HIGH (ref 0–200)
HDL: 47 mg/dL (ref >39.00)
LDL Cholesterol: 139 mg/dL — ABNORMAL HIGH (ref 0–99)
NonHDL: 171.21
Total CHOL/HDL Ratio: 5
Triglycerides: 162 mg/dL — ABNORMAL HIGH (ref 0.0–149.0)
VLDL: 32.4 mg/dL (ref 0.0–40.0)

## 2020-04-03 LAB — HEMOGLOBIN A1C: Hgb A1c MFr Bld: 5.8 % (ref 4.6–6.5)

## 2020-04-03 LAB — CBC WITH DIFFERENTIAL/PLATELET
Basophils Absolute: 0.1 10*3/uL (ref 0.0–0.1)
Basophils Relative: 0.8 % (ref 0.0–3.0)
Eosinophils Absolute: 0.3 10*3/uL (ref 0.0–0.7)
Eosinophils Relative: 3.4 % (ref 0.0–5.0)
HCT: 43.4 % (ref 36.0–46.0)
Hemoglobin: 14.7 g/dL (ref 12.0–15.0)
Lymphocytes Relative: 32.6 % (ref 12.0–46.0)
Lymphs Abs: 2.5 10*3/uL (ref 0.7–4.0)
MCHC: 33.8 g/dL (ref 30.0–36.0)
MCV: 89.7 fl (ref 78.0–100.0)
Monocytes Absolute: 0.4 10*3/uL (ref 0.1–1.0)
Monocytes Relative: 5.5 % (ref 3.0–12.0)
Neutro Abs: 4.4 10*3/uL (ref 1.4–7.7)
Neutrophils Relative %: 57.7 % (ref 43.0–77.0)
Platelets: 261 10*3/uL (ref 150.0–400.0)
RBC: 4.84 Mil/uL (ref 3.87–5.11)
RDW: 13.8 % (ref 11.5–15.5)
WBC: 7.6 10*3/uL (ref 4.0–10.5)

## 2020-04-03 LAB — COMPREHENSIVE METABOLIC PANEL
ALT: 34 U/L (ref 0–35)
AST: 26 U/L (ref 0–37)
Albumin: 4.2 g/dL (ref 3.5–5.2)
Alkaline Phosphatase: 114 U/L (ref 39–117)
BUN: 23 mg/dL (ref 6–23)
CO2: 31 mEq/L (ref 19–32)
Calcium: 9.9 mg/dL (ref 8.4–10.5)
Chloride: 101 mEq/L (ref 96–112)
Creatinine, Ser: 1.01 mg/dL (ref 0.40–1.20)
GFR: 57.31 mL/min — ABNORMAL LOW (ref >60.00)
Glucose, Bld: 113 mg/dL — ABNORMAL HIGH (ref 70–99)
Potassium: 4.2 mEq/L (ref 3.5–5.1)
Sodium: 139 mEq/L (ref 135–145)
Total Bilirubin: 0.8 mg/dL (ref 0.2–1.2)
Total Protein: 7.5 g/dL (ref 6.0–8.3)

## 2020-04-03 MED ORDER — LOSARTAN POTASSIUM 50 MG PO TABS: 50.0000 mg | ORAL_TABLET | Freq: Every day | ORAL | 1 refills | Status: DC

## 2020-04-03 MED ORDER — POTASSIUM CHLORIDE CRYS ER 20 MEQ PO TBCR: 20.0000 meq | EXTENDED_RELEASE_TABLET | Freq: Every day | ORAL | 3 refills | Status: DC

## 2020-04-03 NOTE — Progress Notes (Signed)
Jodi Bailey is a 54 y.o. female with the following history as recorded in EpicCare:  Patient Active Problem List   Diagnosis Date Noted  . Paroxysmal atrial fibrillation ([place]) 06/18/2019  . Morbid obesity ([place]) 06/18/2019  . Hypertension 02/15/2017  . Headache 01/27/2017  . Bilateral swelling of feet and ankles 01/02/2017  . Routine general medical examination at a health care facility 10/15/2016  . Otalgia of both ears 09/26/2016  . Headache(784.0) 07/13/2014  . Arrhythmia 07/13/2014  . Dizziness 07/13/2014  . Obesity (BMI 30-39.9) 03/16/2014  . Depression 08/31/2013  . LOW BACK PAIN SYNDROME 02/18/2011  . SKIN CANCER, HX OF 02/18/2011  . BACK PAIN 02/04/2011    Current Outpatient Medications  Medication Sig Dispense Refill  . acetaminophen (TYLENOL) 325 MG tablet Take 650 mg by mouth every 6 (six) hours as needed. As needed for pain/headaches    . Cholecalciferol (VITAMIN D PO) Take 1 tablet by mouth daily.    . furosemide (LASIX) 20 MG tablet Take 1 tablet (20 mg total) by mouth 2 (two) times daily as needed for edema. 180 tablet 0  . Multiple Vitamins-Minerals (MULTIVITAMIN PO) Take 1 tablet by mouth daily.    . potassium chloride SA (KLOR-CON) 20 MEQ tablet Take 1 tablet (20 mEq total) by mouth daily. 90 tablet 3  . losartan (COZAAR) 50 MG tablet Take 1 tablet (50 mg total) by mouth daily. 30 tablet 1  . ondansetron (ZOFRAN ODT) 8 MG disintegrating tablet Take 1 tablet (8 mg total) by mouth every 8 (eight) hours as needed for nausea or vomiting. (Patient not taking: Reported on 04/03/2020) 20 tablet 0   No current facility-administered medications for this visit.    Allergies: Codeine, Diovan [valsartan], Lisinopril, and Sulfa antibiotics  Past Medical History:  Diagnosis Date  . Anxiety   . Atrial fibrillation ([place])   . Chronic headaches   . Depression   . Hypertension   . Palpitations     No past surgical history on file.  Family History  Problem Relation Age  of Onset  . Depression Sister        situational  . Depression Son   . Suicidality Son   . Crohn's disease Mother   . Diverticulitis Mother   . Hypertension Father   . Diabetes Father   . Heart disease Father   . Hyperlipidemia Maternal Grandmother   . Hypertension Maternal Grandmother   . Osteoporosis Maternal Grandmother   . Stroke Maternal Grandfather   . Colon cancer Paternal Grandmother   . Heart disease Paternal Grandfather   . Heart attack Paternal Grandfather   . Alcohol abuse Neg Hx     Social History   Tobacco Use  . Smoking status: Never Smoker  . Smokeless tobacco: Never Used  Substance Use Topics  . Alcohol use: Yes    Comment: Very RARELY    Subjective:  Requesting CPE today but only wants to do labs; prefers to put mammogram/ potential sleep/ pap smear/ colonoscopy off until later this year;  Follow-up on hypertension; is taking Lasix 2 x per day- still having problems with swelling; Denies any chest pain, shortness of breath, blurred vision or headache. Admits that stress level is high- helping to care for elderly parents;  Left shoulder pain secondary to fall this weekend;   Review of Systems  Constitutional: Negative.   HENT: Negative.   Eyes: Negative.   Respiratory: Negative.   Cardiovascular: Positive for leg swelling.  Genitourinary: Negative.   Musculoskeletal:  Positive for joint pain.  Skin: Negative.   Neurological: Negative.   Endo/Heme/Allergies: Negative.   Psychiatric/Behavioral: Negative.      Objective:  Vitals:   04/03/20 1023  BP: (!) 146/90  Pulse: 70  Temp: 98.4 F (36.9 C)  TempSrc: Oral  SpO2: 98%  Weight: 262 lb (118.8 kg)  Height: '5\' 7"'$  (1.702 m)    General: Well developed, well nourished, in no acute distress  Skin : Warm and dry.  Head: Normocephalic and atraumatic  Eyes: Sclera and conjunctiva clear; pupils round and reactive to light; extraocular movements intact  Ears: External normal; canals clear;  tympanic membranes normal  Oropharynx: Pink, supple. No suspicious lesions  Neck: Supple without thyromegaly, adenopathy  Lungs: Respirations unlabored; clear to auscultation bilaterally without wheeze, rales, rhonchi  CVS exam: normal rate and regular rhythm.  Abdomen: Soft; nontender; nondistended; normoactive bowel sounds; no masses or hepatosplenomegaly  Musculoskeletal: No deformities; no active joint inflammation  Extremities: No edema, cyanosis, clubbing  Vessels: Symmetric bilaterally  Neurologic: Alert and oriented; speech intact; face symmetrical; moves all extremities well; CNII-XII intact without focal deficit   Assessment:  1. PE (physical exam), annual   2. Pedal edema   3. Acute pain of left shoulder   4. Lipid screening     Plan:  Age appropriate preventive healthcare needs addressed; encouraged regular eye doctor and dental exams; encouraged regular exercise; will update labs and refills as needed today; follow-up to be determined; Patient defers pap smear, mammogram, colonoscopy or sleep study at this time- agrees to re-consider later this year; She will also need to consider follow-up with her cardiologist to update echocardiogram;  Try adding Losartan 50 mg daily to her Lasix 20 mg; am concerned about the persisting swelling on Lasix 20 mg bid; follow-up in 1 month, sooner prn; Update X-ray of left humerus- may need to see sports medicine;  This visit occurred during the SARS-CoV-2 public health emergency.  Safety protocols were in place, including screening questions prior to the visit, additional usage of staff PPE, and extensive cleaning of exam room while observing appropriate contact time as indicated for disinfecting solutions.      No follow-ups on file.  Orders Placed This Encounter  Procedures  . DG Humerus Left    Standing Status:   Future    Number of Occurrences:   1    Standing Expiration Date:   06/03/2021    Order Specific Question:   Reason for  Exam (SYMPTOM  OR DIAGNOSIS REQUIRED)    Answer:   left shoulder pain    Order Specific Question:   Is patient pregnant?    Answer:   No    Order Specific Question:   Preferred imaging location?    Answer:   [facility]    Order Specific Question:   Radiology Contrast Protocol - do NOT remove file path    Answer:   [email or web address]  . HgB A1c  . B Nat Peptide  . CBC with Differential/Platelet  . Comp Met (CMET)  . Lipid panel    Requested Prescriptions   Signed Prescriptions Disp Refills  . losartan (COZAAR) 50 MG tablet 30 tablet 1    Sig: Take 1 tablet (50 mg total) by mouth daily.  . potassium chloride SA (KLOR-CON) 20 MEQ tablet 90 tablet 3    Sig: Take 1 tablet (20 mEq total) by mouth daily.

## 2020-04-04 LAB — BRAIN NATRIURETIC PEPTIDE: Pro B Natriuretic peptide (BNP): 64 pg/mL (ref 0.0–100.0)

## 2020-04-21 ENCOUNTER — Encounter: Payer: Self-pay | Admitting: Family Medicine

## 2020-04-21 ENCOUNTER — Other Ambulatory Visit: Payer: Self-pay

## 2020-04-21 ENCOUNTER — Ambulatory Visit (INDEPENDENT_AMBULATORY_CARE_PROVIDER_SITE_OTHER): Payer: 59 | Admitting: Family Medicine

## 2020-04-21 ENCOUNTER — Ambulatory Visit: Payer: Self-pay

## 2020-04-21 VITALS — BP 130/84 | HR 81 | Ht 67.0 in | Wt 264.0 lb

## 2020-04-21 DIAGNOSIS — S46012A Strain of muscle(s) and tendon(s) of the rotator cuff of left shoulder, initial encounter: Secondary | ICD-10-CM

## 2020-04-21 DIAGNOSIS — M25512 Pain in left shoulder: Secondary | ICD-10-CM | POA: Diagnosis not present

## 2020-04-21 NOTE — Progress Notes (Signed)
Subjective:    CC: L shoulder pain  I, Jodi Bailey, LAT, ATC, am serving as scribe for Dr. Lynne Bailey.  HPI: Pt is a 54 y/o female presenting w/ c/o L shoulder pain since early May 2021 after suffering a fall and landing on her L hand and rolled onto her L shoulder.  She locates her pain to her L lateral shoulder, radiating into her L upper arm.  She rates her pain as mild and describes her pain as a dull, ache.  She plays paintball with her adult sons in a competitive setting.  She is interested in returning to play in a safe way.  She works as an Medical illustrator.  Radiating pain: yes into her L upper arm L shoulder mechanical symptoms: initially yes but not currently Aggravating factors: functional IR; dressing; L shoulder overhead AROM Treatments tried: Advil, Tylenol, ice  Diagnostic testing: L humerus XR- 04/03/20  Pertinent review of Systems: No fevers or chills  Relevant historical information: Hypertension, history of paroxysmal atrial fibrillation   Objective:    Vitals:   04/21/20 0805  BP: 130/84  Pulse: 81  SpO2: 97%   General: Well Developed, well nourished, and in no acute distress.   MSK: C-spine normal-appearing normal cervical motion.  Left shoulder normal-appearing Nontender. Range of motion abduction full painful arc.  External rotation full.  Internal rotation limited to lumbar spine. Strength intact abduction external and internal rotation some pain with resisted abduction. Positive empty can test.  Positive Hawkins test negative Neer's test. Negative Yergason's and speeds test.  Contralateral right shoulder normal-appearing nontender normal motion normal strength negative impingement and biceps tendinitis testing.  Lab and Radiology Results DG Humerus Left  Result Date: 04/03/2020 CLINICAL DATA:  Left shoulder pain after recent fall. EXAM: LEFT HUMERUS - 2+ VIEW COMPARISON:  None. FINDINGS: There is no evidence of fracture or other focal bone  lesions. Soft tissues are unremarkable. IMPRESSION: Negative. Electronically Signed   By: Marijo Conception M.D.   On: 04/03/2020 15:49   I, Jodi Bailey, personally (independently) visualized and performed the interpretation of the images attached in this note.  Diagnostic Limited MSK Ultrasound of: Left shoulder Biceps tendon intact in bicipital groove. Subscapularis tendon is intact.  Slight hyperechoic fleck at distal tendon insertion indicating either tiny avulsion or old calcific tendinopathy.  No significant increased Doppler activity at this area. Supraspinatus tendon is intact without obvious tear. Increased subacromial bursa thickness present. Infraspinatus tendon is intact. AC joint degenerative with large effusion. 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    Impression and Recommendations:    Assessment and Plan: 54 y.o. female with left shoulder pain after fall occurring 2.5 weeks ago.  Discussed treatment options.  Plan for conservative management with home exercises taught in clinic today by ATC, and referral to physical therapy.  Additionally recommend topical Voltaren gel as this may help.  Discussed possibility of subacromial injection today patient would like to delay it a bit and try conservative management first which is reasonable.  Recheck 4 to 6 weeks if not improved.  Okay to return or contact me sooner.  Would be happy to do steroid injection at any time.  Additionally spent time discussing return to play for paintball.  My main concern as this is a competitive environment with opportunities to fall.  Recommend against returning to play aggressively until feeling much better.  Okay to start practice and more controlled play sooner if able.  [id number]; 15  additional minutes spent for Therapeutic exercises as stated in above notes.  This included exercises focusing on stretching, strengthening, with significant focus on eccentric aspects.   Long term goals  include an improvement in range of motion, strength, endurance as well as avoiding reinjury. Patient's frequency would include in 1-2 times a day, 3-5 times a week for a duration of 6-12 weeks.  Proper technique shown and discussed handout in great detail with ATC.  All questions were discussed and answered.    Orders Placed This Encounter  Procedures  . [place] LIMITED JOINT SPACE STRUCTURES UP LEFT(NO LINKED CHARGES)    Order Specific Question:   Reason for Exam (SYMPTOM  OR DIAGNOSIS REQUIRED)    Answer:   L shoulder pain    Order Specific Question:   Preferred imaging location?    Answer:   [facility]  . Ambulatory referral to Physical Therapy    Referral Priority:   Routine    Referral Type:   Physical Medicine    Referral Reason:   Specialty Services Required    Requested Specialty:   Physical Therapy   No orders of the defined types were placed in this encounter.   Discussed warning signs or symptoms. Please see discharge instructions. Patient expresses understanding.   The above documentation has been reviewed and is accurate and complete Jodi Bailey, M.D.

## 2020-04-21 NOTE — Patient Instructions (Addendum)
Thank you for coming in today. Attend PT.  Do the home exercises.  Ok to add voltaren gel up to 4x daily.  If not improving ok to let me know and I can do an injection sooner.  Recheck in 4-6 weeks especially if not better.   Ok to play if you can do so without a lot of pain and your arm is strong.   Please perform the exercise program that we have prepared for you and gone over in detail on a daily basis.  In addition to the handout you were provided you can access your program through: [email or web address]   Your unique program code is: [id number]

## 2020-04-26 ENCOUNTER — Other Ambulatory Visit: Payer: Self-pay | Admitting: Family

## 2020-04-26 MED ORDER — LOSARTAN POTASSIUM 50 MG PO TABS: 50.0000 mg | ORAL_TABLET | Freq: Every day | ORAL | 1 refills | Status: DC

## 2020-05-02 ENCOUNTER — Ambulatory Visit (INDEPENDENT_AMBULATORY_CARE_PROVIDER_SITE_OTHER): Payer: 59 | Admitting: Physical Therapy

## 2020-05-02 ENCOUNTER — Other Ambulatory Visit: Payer: Self-pay

## 2020-05-02 DIAGNOSIS — M25512 Pain in left shoulder: Secondary | ICD-10-CM | POA: Diagnosis not present

## 2020-05-03 ENCOUNTER — Ambulatory Visit (INDEPENDENT_AMBULATORY_CARE_PROVIDER_SITE_OTHER): Payer: 59 | Admitting: Family

## 2020-05-03 ENCOUNTER — Encounter: Payer: Self-pay | Admitting: Family

## 2020-05-03 ENCOUNTER — Other Ambulatory Visit: Payer: Self-pay

## 2020-05-03 ENCOUNTER — Encounter: Payer: Self-pay | Admitting: Physical Therapy

## 2020-05-03 VITALS — BP 138/80 | HR 74 | Temp 98.2°F | Ht 67.0 in | Wt 263.6 lb

## 2020-05-03 DIAGNOSIS — I10 Essential (primary) hypertension: Secondary | ICD-10-CM

## 2020-05-03 DIAGNOSIS — M25512 Pain in left shoulder: Secondary | ICD-10-CM

## 2020-05-03 DIAGNOSIS — R7303 Prediabetes: Secondary | ICD-10-CM | POA: Diagnosis not present

## 2020-05-03 MED ORDER — LOSARTAN POTASSIUM 50 MG PO TABS: 100.0000 mg | ORAL_TABLET | Freq: Every day | ORAL | 0 refills | Status: DC

## 2020-05-03 NOTE — Progress Notes (Signed)
Jodi Bailey is a 54 y.o. female with the following history as recorded in EpicCare:  Patient Active Problem List   Diagnosis Date Noted   Paroxysmal atrial fibrillation ([place]) 06/18/2019   Morbid obesity ([place]) 06/18/2019   Hypertension 02/15/2017   Headache 01/27/2017   Bilateral swelling of feet and ankles 01/02/2017   Routine general medical examination at a health care facility 10/15/2016   Otalgia of both ears 09/26/2016   Headache(784.0) 07/13/2014   Arrhythmia 07/13/2014   Dizziness 07/13/2014   Obesity (BMI 30-39.9) 03/16/2014   Depression 08/31/2013   LOW BACK PAIN SYNDROME 02/18/2011   SKIN CANCER, HX OF 02/18/2011   BACK PAIN 02/04/2011    Current Outpatient Medications  Medication Sig Dispense Refill   acetaminophen (TYLENOL) 325 MG tablet Take 650 mg by mouth every 6 (six) hours as needed. As needed for pain/headaches     Cholecalciferol (VITAMIN D PO) Take 1 tablet by mouth daily.     furosemide (LASIX) 20 MG tablet Take 20 mg by mouth daily.     losartan (COZAAR) 50 MG tablet Take 2 tablets (100 mg total) by mouth daily. 90 tablet 0   Multiple Vitamins-Minerals (MULTIVITAMIN PO) Take 1 tablet by mouth daily.     potassium chloride SA (KLOR-CON) 20 MEQ tablet Take 1 tablet (20 mEq total) by mouth daily. 90 tablet 3   No current facility-administered medications for this visit.    Allergies: Codeine, Diovan [valsartan], Lisinopril, and Sulfa antibiotics  Past Medical History:  Diagnosis Date   Anxiety    Atrial fibrillation (HCC)    Chronic headaches    Depression    Hypertension    Palpitations     History reviewed. No pertinent surgical history.  Family History  Problem Relation Age of Onset   Depression Sister        situational   Depression Son    Suicidality Son    Crohn's disease Mother    Diverticulitis Mother    Hypertension Father    Diabetes Father    Heart disease Father    Hyperlipidemia Maternal  Grandmother    Hypertension Maternal Grandmother    Osteoporosis Maternal Grandmother    Stroke Maternal Grandfather    Colon cancer Paternal Grandmother    Heart disease Paternal Grandfather    Heart attack Paternal Grandfather    Alcohol abuse Neg Hx     Social History   Tobacco Use   Smoking status: Never Smoker   Smokeless tobacco: Never Used  Substance Use Topics   Alcohol use: Yes    Comment: Very RARELY    Subjective:  1 month follow up on hypertension; was started on Losartan 50 mg daily at last OV; has continued her Lasix at 20 mg daily; notes that she is feeling better with the combination;  Has started PT yesterday for left shoulder pain; very optimistic about how this program will help her;    Objective:  Vitals:   05/03/20 0847  BP: 138/80  Pulse: 74  Temp: 98.2 F (36.8 C)  TempSrc: Oral  SpO2: 97%  Weight: 263 lb 9.6 oz (119.6 kg)  Height: 5\' 7"  (1.702 m)    General: Well developed, well nourished, in no acute distress  Skin : Warm and dry.  Head: Normocephalic and atraumatic  Lungs: Respirations unlabored; clear to auscultation bilaterally without wheeze, rales, rhonchi  CVS exam: normal rate and regular rhythm.  Extremities: No edema, cyanosis, clubbing  Vessels: Symmetric bilaterally  Neurologic: Alert and oriented;  speech intact; face symmetrical; moves all extremities well; CNII-XII intact without focal deficit    Assessment:  1. Essential hypertension   2. Pre-diabetes   3. Acute pain of left shoulder     Plan:  1. Control improving; increase Losartan to 100 mg daily; continue Lasix 20 mg daily; follow-up in 3 months; 2. Reviewed recent labs; Hgba1c was improved; continue working on diet and weight loss goals; 3. Continue with PT as scheduled; may want to get injection with sports med- she will ask PT opinion.  This visit occurred during the SARS-CoV-2 public health emergency.  Safety protocols were in place, including screening  questions prior to the visit, additional usage of staff PPE, and extensive cleaning of exam room while observing appropriate contact time as indicated for disinfecting solutions.     Return in about 3 months (around 08/03/2020).  No orders of the defined types were placed in this encounter.   Requested Prescriptions   Signed Prescriptions Disp Refills   losartan (COZAAR) 50 MG tablet 90 tablet 0    Sig: Take 2 tablets (100 mg total) by mouth daily.
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
## 2020-05-03 NOTE — Patient Instructions (Signed)
Access Code: [email or web address] URL: [email or web address] Date: 05/02/2020 Prepared by: Lyndee Hensen  Exercises Supine Shoulder Flexion Extension AAROM with Dowel - 1-2 x daily - 1 sets - 10 reps - 5 hold Supine Shoulder External Rotation with Dowel - 1-2 x daily - 1 sets - 10 reps - 5 hold Supine Chest Stretch with Elbows Bent - 1 x daily - 1 sets - 10 reps - 5 hold Shoulder External Rotation with Anchored Resistance - 1 x daily - 2 sets - 10 reps Standing Shoulder Internal Rotation with Anchored Resistance - 1 x daily - 2 sets - 10 reps

## 2020-05-04 ENCOUNTER — Ambulatory Visit (INDEPENDENT_AMBULATORY_CARE_PROVIDER_SITE_OTHER): Payer: 59 | Admitting: Physical Therapy

## 2020-05-04 DIAGNOSIS — M25512 Pain in left shoulder: Secondary | ICD-10-CM | POA: Diagnosis not present

## 2020-05-05 ENCOUNTER — Encounter: Payer: Self-pay | Admitting: Physical Therapy

## 2020-05-05 NOTE — Therapy (Signed)
[facility] [address] [place], [place], [address] Phone: [phone]   Fax:  [phone]  Physical Therapy Treatment  Patient Details  Name: Jodi Bailey MRN: [id number] Date of Birth: [birth year] Referring Provider (PT): Lynne Leader   Encounter Date: 05/04/2020  PT End of Session - 05/05/20 1344    Visit Number  2    Number of Visits  12    Date for PT Re-Evaluation  06/13/20    Authorization Type  UHC    PT Start Time  4235    PT Stop Time  1559    PT Time Calculation (min)  43 min    Activity Tolerance  Patient tolerated treatment well    Behavior During Therapy  [facility] for tasks assessed/performed       Past Medical History:  Diagnosis Date  . Anxiety   . Atrial fibrillation ([place])   . Chronic headaches   . Depression   . Hypertension   . Palpitations     History reviewed. No pertinent surgical history.  There were no vitals filed for this visit.  Subjective Assessment - 05/05/20 1344    Subjective  Pt states mild decreased pain, has been doing HEP.    Currently in Pain?  Yes    Pain Score  4     Pain Location  Shoulder    Pain Orientation  Left    Pain Descriptors / Indicators  Aching    Pain Type  Acute pain    Pain Onset  1 to 4 weeks ago    Pain Frequency  Intermittent                        [facility] Adult PT Treatment/Exercise - 05/04/20 1526      Exercises   Exercises  Shoulder      Shoulder Exercises: Supine   External Rotation  --    External Rotation Limitations  --    Flexion  15 reps;AAROM    Flexion Limitations  cane      Shoulder Exercises: Standing   External Rotation  Left;15 reps    Theraband Level (Shoulder External Rotation)  Level 2 (Red)    Internal Rotation  Left;15 reps    Theraband Level (Shoulder Internal Rotation)  Level 2 (Red)    Row  20 reps    Theraband Level (Shoulder Row)  Level 3 (Green)    Retraction  10 reps    Other Standing Exercises  Wall slides  x 10 on R;       Shoulder Exercises: Pulleys   Flexion  2 minutes    ABduction  1 minute      Shoulder Exercises: Stretch   External Rotation Stretch  --    Table Stretch - Flexion  5 reps;10 seconds      Manual Therapy   Manual Therapy  Joint mobilization;Soft tissue mobilization;Passive ROM    Joint Mobilization  inf and post GHJ mobs gr 3 ;    Soft tissue mobilization  L pec, bicep and anterior shoulder     Passive ROM  L shoulder, all motions               PT Short Term Goals - 05/03/20 0900      PT SHORT TERM GOAL #1   Title  Pt to be independent with intial HEP    Time  2    Period  Weeks    Status  New    Target Date  05/16/20      PT SHORT TERM GOAL #2   Title  Pt to report decreased pain in L shoulder to 4/10    Time  2    Period  Weeks    Status  New    Target Date  05/16/20        PT Long Term Goals - 05/03/20 0902      PT LONG TERM GOAL #1   Title  Pt to be independent with final HEP    Time  6    Period  Weeks    Status  New    Target Date  06/13/20      PT LONG TERM GOAL #2   Title  Pt to report decreased pain in L shoulder to 0-2/10 with reaching and IADLS.    Time  6    Period  Weeks    Status  New    Target Date  06/13/20      PT LONG TERM GOAL #3   Title  Pt to demo full AROM of L shoulder without pain, to improve ability for ADLs and IADLS.    Time  6    Period  Weeks    Status  New    Target Date  06/13/20      PT LONG TERM GOAL #4   Title  Pt to demo improved strength of L shoulder girdle to at least 4+/5 for all motions, to improve ability for lifting, carrying, and IADLs.    Time  6    Period  Weeks    Status  New    Target Date  06/13/20            Plan - 05/05/20 1347    Clinical Impression Statement  Pt with improved PROM from last visit, and decreased pain with PROM as well. Ther ex progressed for light ROM and stretching. HEP updated, plan to progress as tolerated.    Examination-Activity Limitations   Bathing;Reach Overhead;Carry;Dressing;Hygiene/Grooming;Lift    Examination-Participation Restrictions  Meal Prep;Cleaning;Community Activity;Driving;Shop;Laundry;Yard Work    Stability/Clinical Decision Making  Stable/Uncomplicated    Rehab Potential  Good    PT Frequency  2x / week    PT Duration  6 weeks    PT Treatment/Interventions  ADLs/Self Care Home Management;Cryotherapy;Electrical Stimulation;Iontophoresis 4mg /ml Dexamethasone;Moist Heat;DME Instruction;Neuromuscular re-education;Therapeutic exercise;Therapeutic activities;Functional mobility training;Patient/family education;Manual techniques;Taping;Dry needling;Passive range of motion;Joint Manipulations;Spinal Manipulations;Vasopneumatic Device    PT Home Exercise Plan  [email or web address]    Consulted and Agree with Plan of Care  Patient       Patient will benefit from skilled therapeutic intervention in order to improve the following deficits and impairments:  Decreased range of motion, Impaired UE functional use, Increased muscle spasms, Decreased activity tolerance, Pain, Impaired flexibility, Improper body mechanics, Decreased mobility, Decreased strength  Visit Diagnosis: Acute pain of left shoulder     Problem List Patient Active Problem List   Diagnosis Date Noted  . Paroxysmal atrial fibrillation ([place]) 06/18/2019  . Morbid obesity ([place]) 06/18/2019  . Hypertension 02/15/2017  . Headache 01/27/2017  . Bilateral swelling of feet and ankles 01/02/2017  . Routine general medical examination at a health care facility 10/15/2016  . Otalgia of both ears 09/26/2016  . Headache(784.0) 07/13/2014  . Arrhythmia 07/13/2014  . Dizziness 07/13/2014  . Obesity (BMI 30-39.9) 03/16/2014  . Depression 08/31/2013  . LOW BACK PAIN SYNDROME 02/18/2011  . SKIN CANCER, HX OF 02/18/2011  . BACK PAIN  02/04/2011    Lyndee Hensen, PT, DPT 1:50 PM  05/05/20    [facility] [place], [place], [address] Phone: [phone]   Fax:  [phone]  Name: Jodi Bailey MRN: [id number] Date of Birth: 10-25-1966

## 2020-05-09 ENCOUNTER — Other Ambulatory Visit: Payer: Self-pay

## 2020-05-09 ENCOUNTER — Encounter: Payer: Self-pay | Admitting: Physical Therapy

## 2020-05-09 ENCOUNTER — Ambulatory Visit (INDEPENDENT_AMBULATORY_CARE_PROVIDER_SITE_OTHER): Payer: 59 | Admitting: Physical Therapy

## 2020-05-09 DIAGNOSIS — M25512 Pain in left shoulder: Secondary | ICD-10-CM

## 2020-05-11 ENCOUNTER — Ambulatory Visit (INDEPENDENT_AMBULATORY_CARE_PROVIDER_SITE_OTHER): Payer: 59 | Admitting: Physical Therapy

## 2020-05-11 ENCOUNTER — Other Ambulatory Visit: Payer: Self-pay

## 2020-05-11 DIAGNOSIS — M25512 Pain in left shoulder: Secondary | ICD-10-CM | POA: Diagnosis not present

## 2020-05-12 ENCOUNTER — Encounter: Payer: Self-pay | Admitting: Physical Therapy

## 2020-05-12 NOTE — Therapy (Signed)
[facility] [address] [place], [place], [address] Phone: [phone]   Fax:  [phone]  Physical Therapy Treatment  Patient Details  Name: Jodi Bailey MRN: [id number] Date of Birth: Mar 14, 1966 Referring Provider (PT): Lynne Leader   Encounter Date: 05/09/2020   PT End of Session - 05/12/20 1518    Visit Number 3    Number of Visits 12    Date for PT Re-Evaluation 06/13/20    Authorization Type UHC    PT Start Time 3557    PT Stop Time 1515    PT Time Calculation (min) 39 min    Activity Tolerance Patient tolerated treatment well    Behavior During Therapy [facility] for tasks assessed/performed           Past Medical History:  Diagnosis Date  . Anxiety   . Atrial fibrillation ([place])   . Chronic headaches   . Depression   . Hypertension   . Palpitations     History reviewed. No pertinent surgical history.  There were no vitals filed for this visit.   Subjective Assessment - 05/12/20 1517    Subjective Pt states soreness, imroved motion overhead, continued pain with IR behind back.    Currently in Pain? Yes    Pain Score 4     Pain Location Shoulder    Pain Orientation Left    Pain Descriptors / Indicators Aching    Pain Onset More than a month ago                             [facility] Adult PT Treatment/Exercise - 05/12/20 0001      Exercises   Exercises Shoulder      Shoulder Exercises: Supine   External Rotation 15 reps    External Rotation Limitations cane    Flexion 15 reps;AAROM    Flexion Limitations cane      Shoulder Exercises: Standing   External Rotation Left;15 reps    Theraband Level (Shoulder External Rotation) Level 2 (Red)    Internal Rotation Left;15 reps    Theraband Level (Shoulder Internal Rotation) Level 2 (Red)    Row 20 reps    Theraband Level (Shoulder Row) Level 3 (Green)    Other Standing Exercises IR behind back, Ext x 10, Side x 10, IR x 10;     Other Standing  Exercises Scaption full ROM x 15: Abd to 90 deg x 10;       Shoulder Exercises: Pulleys   Flexion 2 minutes    ABduction 1 minute      Shoulder Exercises: Stretch   Other Shoulder Stretches supine pec stretch 30 sec x 3       Manual Therapy   Manual Therapy Joint mobilization;Soft tissue mobilization;Passive ROM    Joint Mobilization inf and post GHJ mobs gr 3 ; LAD    Soft tissue mobilization L pec, bicep and anterior shoulder     Passive ROM L shoulder, all motions                  PT Education - 05/12/20 1518    Education Details HEP reviewed            PT Short Term Goals - 05/03/20 0900      PT SHORT TERM GOAL #1   Title Pt to be independent with intial HEP    Time 2    Period Weeks  Status New    Target Date 05/16/20      PT SHORT TERM GOAL #2   Title Pt to report decreased pain in L shoulder to 4/10    Time 2    Period Weeks    Status New    Target Date 05/16/20             PT Long Term Goals - 05/03/20 0902      PT LONG TERM GOAL #1   Title Pt to be independent with final HEP    Time 6    Period Weeks    Status New    Target Date 06/13/20      PT LONG TERM GOAL #2   Title Pt to report decreased pain in L shoulder to 0-2/10 with reaching and IADLS.    Time 6    Period Weeks    Status New    Target Date 06/13/20      PT LONG TERM GOAL #3   Title Pt to demo full AROM of L shoulder without pain, to improve ability for ADLs and IADLS.    Time 6    Period Weeks    Status New    Target Date 06/13/20      PT LONG TERM GOAL #4   Title Pt to demo improved strength of L shoulder girdle to at least 4+/5 for all motions, to improve ability for lifting, carrying, and IADLs.    Time 6    Period Weeks    Status New    Target Date 06/13/20                 Plan - 05/12/20 1519    Clinical Impression Statement Pt with improving ROM, still quite sore at end of available range for flexion and ER, as well as IR behind back. HEP updated.  Plan to progress as able.    Examination-Activity Limitations Bathing;Reach Overhead;Carry;Dressing;Hygiene/Grooming;Lift    Examination-Participation Restrictions Meal Prep;Cleaning;Community Activity;Driving;Laundry;Yard Work    Stability/Clinical Decision Making Stable/Uncomplicated    Rehab Potential Good    PT Frequency 2x / week    PT Duration 6 weeks    PT Treatment/Interventions ADLs/Self Care Home Management;Cryotherapy;Electrical Stimulation;Iontophoresis 4mg /ml Dexamethasone;Moist Heat;DME Instruction;Neuromuscular re-education;Therapeutic exercise;Therapeutic activities;Functional mobility training;Patient/family education;Manual techniques;Taping;Dry needling;Passive range of motion;Joint Manipulations;Spinal Manipulations;Vasopneumatic Device    PT Home Exercise Plan [id number]    Consulted and Agree with Plan of Care Patient           Patient will benefit from skilled therapeutic intervention in order to improve the following deficits and impairments:  Decreased range of motion, Impaired UE functional use, Increased muscle spasms, Decreased activity tolerance, Pain, Impaired flexibility, Improper body mechanics, Decreased mobility, Decreased strength  Visit Diagnosis: Acute pain of left shoulder     Problem List Patient Active Problem List   Diagnosis Date Noted  . Paroxysmal atrial fibrillation ([place]) 06/18/2019  . Morbid obesity ([place]) 06/18/2019  . Hypertension 02/15/2017  . Headache 01/27/2017  . Bilateral swelling of feet and ankles 01/02/2017  . Routine general medical examination at a health care facility 10/15/2016  . Otalgia of both ears 09/26/2016  . Headache(784.0) 07/13/2014  . Arrhythmia 07/13/2014  . Dizziness 07/13/2014  . Obesity (BMI 30-39.9) 03/16/2014  . Depression 08/31/2013  . LOW BACK PAIN SYNDROME 02/18/2011  . SKIN CANCER, HX OF 02/18/2011  . BACK PAIN 02/04/2011   Lyndee Hensen, PT, DPT 3:23 PM  05/12/20    [facility]  [place], [place], [address] Phone: [phone]   Fax:  [phone]  Name: Jodi Bailey MRN: [id number] Date of Birth: 09/29/1966

## 2020-05-12 NOTE — Therapy (Signed)
[facility] [address] [place], [place], [address] Phone: [phone]   Fax:  [phone]  Physical Therapy Treatment  Patient Details  Name: Jodi Bailey MRN: [id number] Date of Birth: 10/18/1966 Referring Provider (PT): Lynne Leader   Encounter Date: 05/11/2020   PT End of Session - 05/12/20 1525    Visit Number 4    Number of Visits 12    Date for PT Re-Evaluation 06/13/20    Authorization Type UHC    PT Start Time 9518    PT Stop Time 1559    PT Time Calculation (min) 42 min    Activity Tolerance Patient tolerated treatment well    Behavior During Therapy [facility] for tasks assessed/performed           Past Medical History:  Diagnosis Date  . Anxiety   . Atrial fibrillation ([place])   . Chronic headaches   . Depression   . Hypertension   . Palpitations     No past surgical history on file.  There were no vitals filed for this visit.   Subjective Assessment - 05/12/20 1524    Subjective Pt states soreness today, thinks she over did it yesterday with cleaning .    Patient Stated Goals Decreased pain.    Currently in Pain? Yes    Pain Score 4     Pain Location Shoulder    Pain Orientation Left    Pain Descriptors / Indicators Aching    Pain Type Acute pain    Pain Onset More than a month ago    Pain Frequency Intermittent                             [facility] Adult PT Treatment/Exercise - 05/12/20 1527      Exercises   Exercises Shoulder      Shoulder Exercises: Supine   Flexion 15 reps;AAROM    Flexion Limitations cane      Shoulder Exercises: Standing   External Rotation Left;15 reps    Theraband Level (Shoulder External Rotation) Level 2 (Red)    Internal Rotation Left;15 reps    Theraband Level (Shoulder Internal Rotation) Level 2 (Red)    Row 20 reps    Theraband Level (Shoulder Row) Level 3 (Green)    Other Standing Exercises IR behind back, , 2 hand hold x 15;       Shoulder  Exercises: Pulleys   Flexion 3 minutes      Shoulder Exercises: Stretch   Corner Stretch 3 reps;30 seconds    Other Shoulder Stretches supine pec stretch 30 sec x 3       Manual Therapy   Manual Therapy Joint mobilization;Soft tissue mobilization;Passive ROM    Joint Mobilization inf and post GHJ mobs gr 3 ;    Soft tissue mobilization STM/IASTm to deltoid     Passive ROM L shoulder, all motions                    PT Short Term Goals - 05/03/20 0900      PT SHORT TERM GOAL #1   Title Pt to be independent with intial HEP    Time 2    Period Weeks    Status New    Target Date 05/16/20      PT SHORT TERM GOAL #2   Title Pt to report decreased pain in L shoulder to 4/10    Time  2    Period Weeks    Status New    Target Date 05/16/20             PT Long Term Goals - 05/03/20 0902      PT LONG TERM GOAL #1   Title Pt to be independent with final HEP    Time 6    Period Weeks    Status New    Target Date 06/13/20      PT LONG TERM GOAL #2   Title Pt to report decreased pain in L shoulder to 0-2/10 with reaching and IADLS.    Time 6    Period Weeks    Status New    Target Date 06/13/20      PT LONG TERM GOAL #3   Title Pt to demo full AROM of L shoulder without pain, to improve ability for ADLs and IADLS.    Time 6    Period Weeks    Status New    Target Date 06/13/20      PT LONG TERM GOAL #4   Title Pt to demo improved strength of L shoulder girdle to at least 4+/5 for all motions, to improve ability for lifting, carrying, and IADLs.    Time 6    Period Weeks    Status New    Target Date 06/13/20                 Plan - 05/12/20 1526    Clinical Impression Statement Pt with improving motion at each visit, continues to have pain at end ranges. Mild soreness into deltoid today, anterior shoulder still tender. Ther ex progressed for ROM and light strengthening. Plan to progress as able.    Examination-Activity Limitations Bathing;Reach  Overhead;Carry;Dressing;Hygiene/Grooming;Lift    Examination-Participation Restrictions Meal Prep;Cleaning;Community Activity;Driving;Laundry;Yard Work    Stability/Clinical Decision Making Stable/Uncomplicated    Rehab Potential Good    PT Frequency 2x / week    PT Duration 6 weeks    PT Treatment/Interventions ADLs/Self Care Home Management;Cryotherapy;Electrical Stimulation;Iontophoresis 4mg /ml Dexamethasone;Moist Heat;DME Instruction;Neuromuscular re-education;Therapeutic exercise;Therapeutic activities;Functional mobility training;Patient/family education;Manual techniques;Taping;Dry needling;Passive range of motion;Joint Manipulations;Spinal Manipulations;Vasopneumatic Device    PT Home Exercise Plan [id number]    Consulted and Agree with Plan of Care Patient           Patient will benefit from skilled therapeutic intervention in order to improve the following deficits and impairments:  Decreased range of motion, Impaired UE functional use, Increased muscle spasms, Decreased activity tolerance, Pain, Impaired flexibility, Improper body mechanics, Decreased mobility, Decreased strength  Visit Diagnosis: Acute pain of left shoulder     Problem List Patient Active Problem List   Diagnosis Date Noted  . Paroxysmal atrial fibrillation ([place]) 06/18/2019  . Morbid obesity ([place]) 06/18/2019  . Hypertension 02/15/2017  . Headache 01/27/2017  . Bilateral swelling of feet and ankles 01/02/2017  . Routine general medical examination at a health care facility 10/15/2016  . Otalgia of both ears 09/26/2016  . Headache(784.0) 07/13/2014  . Arrhythmia 07/13/2014  . Dizziness 07/13/2014  . Obesity (BMI 30-39.9) 03/16/2014  . Depression 08/31/2013  . LOW BACK PAIN SYNDROME 02/18/2011  . SKIN CANCER, HX OF 02/18/2011  . BACK PAIN 02/04/2011    Lyndee Hensen, PT, DPT 3:29 PM  05/12/20    [facility] [place], [place],  [address] Phone: [phone]   Fax:  [phone]  Name: BRYNNAN RODENBAUGH MRN: [id number] Date of Birth: 1966-05-28

## 2020-05-16 ENCOUNTER — Ambulatory Visit (INDEPENDENT_AMBULATORY_CARE_PROVIDER_SITE_OTHER): Payer: 59 | Admitting: Physical Therapy

## 2020-05-16 ENCOUNTER — Other Ambulatory Visit: Payer: Self-pay

## 2020-05-16 ENCOUNTER — Encounter: Payer: Self-pay | Admitting: Physical Therapy

## 2020-05-16 DIAGNOSIS — M25512 Pain in left shoulder: Secondary | ICD-10-CM | POA: Diagnosis not present

## 2020-05-17 NOTE — Therapy (Signed)
[facility] [address] [place], [place], [address] Phone: [phone]   Fax:  [phone]  Physical Therapy Treatment  Patient Details  Name: Jodi Bailey MRN: [id number] Date of Birth: 10/23/1966 Referring Provider (PT): Lynne Leader   Encounter Date: 05/16/2020   PT End of Session - 05/16/20 1616    Visit Number 5    Number of Visits 12    Date for PT Re-Evaluation 06/13/20    Authorization Type UHC    PT Start Time 1608    PT Stop Time 1646    PT Time Calculation (min) 38 min    Activity Tolerance Patient tolerated treatment well    Behavior During Therapy [facility] for tasks assessed/performed           Past Medical History:  Diagnosis Date  . Anxiety   . Atrial fibrillation ([place])   . Chronic headaches   . Depression   . Hypertension   . Palpitations     History reviewed. No pertinent surgical history.  There were no vitals filed for this visit.   Subjective Assessment - 05/17/20 1346    Subjective Pt states improving movemnt, but arm is sore in deltoid region.    Currently in Pain? Yes    Pain Score 3     Pain Location Shoulder    Pain Orientation Left    Pain Descriptors / Indicators Aching    Pain Type Acute pain    Pain Onset More than a month ago    Pain Frequency Intermittent                             [facility] Adult PT Treatment/Exercise - 05/16/20 1616      Exercises   Exercises Shoulder      Shoulder Exercises: Supine   Flexion 15 reps;AAROM    Flexion Limitations cane      Shoulder Exercises: Standing   External Rotation Left;15 reps    Theraband Level (Shoulder External Rotation) Level 2 (Red)    Internal Rotation Left;15 reps    Theraband Level (Shoulder Internal Rotation) Level 2 (Red)    Row 20 reps    Theraband Level (Shoulder Row) Level 3 (Green)    Other Standing Exercises IR behind back, , 2 hand hold x 5;     Other Standing Exercises AROM scaption x 10;        Shoulder Exercises: Pulleys   Flexion 3 minutes      Shoulder Exercises: ROM/Strengthening   UBE (Upper Arm Bike) UBE x 3 min fwd      Shoulder Exercises: Stretch   Corner Stretch --    Other Shoulder Stretches --      Manual Therapy   Manual Therapy Joint mobilization;Soft tissue mobilization;Passive ROM    Manual therapy comments skilled palpation and monitoring of soft tissue with dry needling     Joint Mobilization inf and post GHJ mobs gr 3 ;    Soft tissue mobilization STM/IASTm to deltoid     Passive ROM L shoulder, all motions            Trigger Point Dry Needling - 05/17/20 0001    Consent Given? Yes    Education Handout Provided Yes    Muscles Treated Upper Quadrant Deltoid    Deltoid Response Twitch response elicited;Palpable increased muscle length   delt and ant delt  L  PT Short Term Goals - 05/03/20 0900      PT SHORT TERM GOAL #1   Title Pt to be independent with intial HEP    Time 2    Period Weeks    Status New    Target Date 05/16/20      PT SHORT TERM GOAL #2   Title Pt to report decreased pain in L shoulder to 4/10    Time 2    Period Weeks    Status New    Target Date 05/16/20             PT Long Term Goals - 05/03/20 0902      PT LONG TERM GOAL #1   Title Pt to be independent with final HEP    Time 6    Period Weeks    Status New    Target Date 06/13/20      PT LONG TERM GOAL #2   Title Pt to report decreased pain in L shoulder to 0-2/10 with reaching and IADLS.    Time 6    Period Weeks    Status New    Target Date 06/13/20      PT LONG TERM GOAL #3   Title Pt to demo full AROM of L shoulder without pain, to improve ability for ADLs and IADLS.    Time 6    Period Weeks    Status New    Target Date 06/13/20      PT LONG TERM GOAL #4   Title Pt to demo improved strength of L shoulder girdle to at least 4+/5 for all motions, to improve ability for lifting, carrying, and IADLs.    Time 6    Period  Weeks    Status New    Target Date 06/13/20                 Plan - 05/17/20 1347    Clinical Impression Statement Pt with good tolerance and good twitch response for DN in deltoid today. Will assess pain relief next visit of muscle trigger point vs referred pain into deltoid. Pt showing improvements with PROM each visit. Decreasing pian in anterior shoulder/bicep as well.    Examination-Activity Limitations Bathing;Reach Overhead;Carry;Dressing;Hygiene/Grooming;Lift    Examination-Participation Restrictions Meal Prep;Cleaning;Community Activity;Driving;Laundry;Yard Work    Stability/Clinical Decision Making Stable/Uncomplicated    Rehab Potential Good    PT Frequency 2x / week    PT Duration 6 weeks    PT Treatment/Interventions ADLs/Self Care Home Management;Cryotherapy;Electrical Stimulation;Iontophoresis 4mg /ml Dexamethasone;Moist Heat;DME Instruction;Neuromuscular re-education;Therapeutic exercise;Therapeutic activities;Functional mobility training;Patient/family education;Manual techniques;Taping;Dry needling;Passive range of motion;Joint Manipulations;Spinal Manipulations;Vasopneumatic Device    PT Home Exercise Plan [id number]    Consulted and Agree with Plan of Care Patient           Patient will benefit from skilled therapeutic intervention in order to improve the following deficits and impairments:  Decreased range of motion, Impaired UE functional use, Increased muscle spasms, Decreased activity tolerance, Pain, Impaired flexibility, Improper body mechanics, Decreased mobility, Decreased strength  Visit Diagnosis: Acute pain of left shoulder     Problem List Patient Active Problem List   Diagnosis Date Noted  . Paroxysmal atrial fibrillation ([place]) 06/18/2019  . Morbid obesity ([place]) 06/18/2019  . Hypertension 02/15/2017  . Headache 01/27/2017  . Bilateral swelling of feet and ankles 01/02/2017  . Routine general medical examination at a health care facility  10/15/2016  . Otalgia of both ears 09/26/2016  . Headache(784.0) 07/13/2014  . Arrhythmia  07/13/2014  . Dizziness 07/13/2014  . Obesity (BMI 30-39.9) 03/16/2014  . Depression 08/31/2013  . LOW BACK PAIN SYNDROME 02/18/2011  . SKIN CANCER, HX OF 02/18/2011  . BACK PAIN 02/04/2011    Lyndee Hensen, PT, DPT 1:49 PM  05/17/20    [facility] [place], [place], [address] Phone: [phone]   Fax:  [phone]  Name: EISA CONAWAY MRN: [id number] Date of Birth: [birth year]

## 2020-05-18 ENCOUNTER — Other Ambulatory Visit: Payer: Self-pay

## 2020-05-18 ENCOUNTER — Ambulatory Visit (INDEPENDENT_AMBULATORY_CARE_PROVIDER_SITE_OTHER): Payer: 59 | Admitting: Physical Therapy

## 2020-05-18 ENCOUNTER — Encounter: Payer: 59 | Admitting: Physical Therapy

## 2020-05-18 DIAGNOSIS — M25512 Pain in left shoulder: Secondary | ICD-10-CM

## 2020-05-20 ENCOUNTER — Other Ambulatory Visit: Payer: Self-pay | Admitting: Family

## 2020-05-20 ENCOUNTER — Encounter: Payer: Self-pay | Admitting: Physical Therapy

## 2020-05-20 NOTE — Therapy (Signed)
[facility] [address] [place], [place], [address] Phone: [phone]   Fax:  [phone]  Physical Therapy Treatment  Patient Details  Name: Jodi Bailey MRN: [id number] Date of Birth: November 05, 1966 Referring Provider (PT): Lynne Leader   Encounter Date: 05/18/2020   PT End of Session - 05/20/20 1500    Visit Number 6    Number of Visits 12    Date for PT Re-Evaluation 06/13/20    Authorization Type UHC    PT Start Time 0933    PT Stop Time 1014    PT Time Calculation (min) 41 min    Activity Tolerance Patient tolerated treatment well    Behavior During Therapy [facility] for tasks assessed/performed           Past Medical History:  Diagnosis Date  . Anxiety   . Atrial fibrillation ([place])   . Chronic headaches   . Depression   . Hypertension   . Palpitations     History reviewed. No pertinent surgical history.  There were no vitals filed for this visit.   Subjective Assessment - 05/20/20 1459    Subjective Pt states continued soreness in deltoid with certain motions. Behind the Back motion still very difficult    Patient Stated Goals Decreased pain.    Currently in Pain? Yes    Pain Location Shoulder    Pain Orientation Left    Pain Descriptors / Indicators Aching    Pain Type Acute pain    Pain Onset More than a month ago    Pain Frequency Intermittent                             [facility] Adult PT Treatment/Exercise - 05/20/20 0001      Exercises   Exercises Shoulder      Shoulder Exercises: Supine   External Rotation 15 reps;AAROM    External Rotation Limitations cane    Flexion 15 reps;AAROM    Flexion Limitations cane      Shoulder Exercises: Standing   Row 20 reps    Theraband Level (Shoulder Row) Level 3 (Green)    Other Standing Exercises IR behind back,, stick, ext, across, and IR  x 10 each;     Other Standing Exercises AROM scaption x 10, abd x 10        Shoulder Exercises:  Pulleys   Flexion 3 minutes      Shoulder Exercises: Stretch   Corner Stretch 3 reps;30 seconds    Corner Stretch Limitations Doorway stretch 45 deg      Manual Therapy   Manual Therapy Joint mobilization;Soft tissue mobilization;Passive ROM    Joint Mobilization inf and post GHJ mobs gr 3 ;    Passive ROM L shoulder, all motions                    PT Short Term Goals - 05/03/20 0900      PT SHORT TERM GOAL #1   Title Pt to be independent with intial HEP    Time 2    Period Weeks    Status New    Target Date 05/16/20      PT SHORT TERM GOAL #2   Title Pt to report decreased pain in L shoulder to 4/10    Time 2    Period Weeks    Status New    Target Date 05/16/20  PT Long Term Goals - 05/03/20 0902      PT LONG TERM GOAL #1   Title Pt to be independent with final HEP    Time 6    Period Weeks    Status New    Target Date 06/13/20      PT LONG TERM GOAL #2   Title Pt to report decreased pain in L shoulder to 0-2/10 with reaching and IADLS.    Time 6    Period Weeks    Status New    Target Date 06/13/20      PT LONG TERM GOAL #3   Title Pt to demo full AROM of L shoulder without pain, to improve ability for ADLs and IADLS.    Time 6    Period Weeks    Status New    Target Date 06/13/20      PT LONG TERM GOAL #4   Title Pt to demo improved strength of L shoulder girdle to at least 4+/5 for all motions, to improve ability for lifting, carrying, and IADLs.    Time 6    Period Weeks    Status New    Target Date 06/13/20                 Plan - 05/20/20 1503    Clinical Impression Statement Pt with improving PROM and AROM. She continues to have pain in to deltoid with repeated elevation and with ER, reaching, and some functional activities. IR behind back improving, but painful and limited. Plan to progress as able.    Examination-Activity Limitations Bathing;Reach Overhead;Carry;Dressing;Hygiene/Grooming;Lift     Examination-Participation Restrictions Meal Prep;Cleaning;Community Activity;Driving;Laundry;Yard Work    Stability/Clinical Decision Making Stable/Uncomplicated    Rehab Potential Good    PT Frequency 2x / week    PT Duration 6 weeks    PT Treatment/Interventions ADLs/Self Care Home Management;Cryotherapy;Electrical Stimulation;Iontophoresis 4mg /ml Dexamethasone;Moist Heat;DME Instruction;Neuromuscular re-education;Therapeutic exercise;Therapeutic activities;Functional mobility training;Patient/family education;Manual techniques;Taping;Dry needling;Passive range of motion;Joint Manipulations;Spinal Manipulations;Vasopneumatic Device    PT Home Exercise Plan [id number]    Consulted and Agree with Plan of Care Patient           Patient will benefit from skilled therapeutic intervention in order to improve the following deficits and impairments:  Decreased range of motion, Impaired UE functional use, Increased muscle spasms, Decreased activity tolerance, Pain, Impaired flexibility, Improper body mechanics, Decreased mobility, Decreased strength  Visit Diagnosis: Acute pain of left shoulder     Problem List Patient Active Problem List   Diagnosis Date Noted  . Paroxysmal atrial fibrillation ([place]) 06/18/2019  . Morbid obesity ([place]) 06/18/2019  . Hypertension 02/15/2017  . Headache 01/27/2017  . Bilateral swelling of feet and ankles 01/02/2017  . Routine general medical examination at a health care facility 10/15/2016  . Otalgia of both ears 09/26/2016  . Headache(784.0) 07/13/2014  . Arrhythmia 07/13/2014  . Dizziness 07/13/2014  . Obesity (BMI 30-39.9) 03/16/2014  . Depression 08/31/2013  . LOW BACK PAIN SYNDROME 02/18/2011  . SKIN CANCER, HX OF 02/18/2011  . BACK PAIN 02/04/2011    Lyndee Hensen, PT, DPT 3:04 PM  05/20/20    [facility] [place], [place], [address] Phone: [phone]   Fax:  [phone]  Name:  Jodi Bailey MRN: [id number] Date of Birth: Mar 10, 1966

## 2020-05-22 ENCOUNTER — Other Ambulatory Visit: Payer: Self-pay | Admitting: Family

## 2020-05-22 ENCOUNTER — Ambulatory Visit (INDEPENDENT_AMBULATORY_CARE_PROVIDER_SITE_OTHER): Payer: 59 | Admitting: Physical Therapy

## 2020-05-22 ENCOUNTER — Encounter: Payer: Self-pay | Admitting: Physical Therapy

## 2020-05-22 ENCOUNTER — Other Ambulatory Visit: Payer: Self-pay

## 2020-05-22 DIAGNOSIS — M25512 Pain in left shoulder: Secondary | ICD-10-CM

## 2020-05-22 MED ORDER — LOSARTAN POTASSIUM 100 MG PO TABS: 100.0000 mg | ORAL_TABLET | Freq: Every day | ORAL | 1 refills | Status: DC

## 2020-05-23 ENCOUNTER — Encounter: Payer: Self-pay | Admitting: Physical Therapy

## 2020-05-23 NOTE — Therapy (Signed)
[facility] [address] [place], [place], [address] Phone: [phone]   Fax:  [phone]  Physical Therapy Treatment  Patient Details  Name: Jodi Bailey MRN: [id number] Date of Birth: [birth year] Referring Provider (PT): Lynne Leader   Encounter Date: 05/22/2020   PT End of Session - 05/22/20 1622    Visit Number 7    Number of Visits 12    Date for PT Re-Evaluation 06/13/20    Authorization Type UHC    PT Start Time 1602    PT Stop Time 1645    PT Time Calculation (min) 43 min    Activity Tolerance Patient tolerated treatment well    Behavior During Therapy [facility] for tasks assessed/performed           Past Medical History:  Diagnosis Date  . Anxiety   . Atrial fibrillation ([place])   . Chronic headaches   . Depression   . Hypertension   . Palpitations     History reviewed. No pertinent surgical history.  There were no vitals filed for this visit.   Subjective Assessment - 05/22/20 1621    Subjective Pt reports pain in front of shoulder and down arm into deltoid.    Patient Stated Goals Decreased pain.    Currently in Pain? Yes    Pain Location Shoulder    Pain Orientation Left    Pain Descriptors / Indicators Aching    Pain Type Acute pain    Pain Onset More than a month ago                             [facility] Adult PT Treatment/Exercise - 05/23/20 0001      Exercises   Exercises Shoulder      Shoulder Exercises: Supine   External Rotation --    External Rotation Limitations --    Flexion 15 reps;AAROM    Flexion Limitations cane      Shoulder Exercises: Standing   External Rotation Left;15 reps    Theraband Level (Shoulder External Rotation) Level 2 (Red)    Internal Rotation Left;15 reps    Theraband Level (Shoulder Internal Rotation) Level 2 (Red)    Row 20 reps    Theraband Level (Shoulder Row) Level 3 (Green)    Other Standing Exercises IR behind back,, stick, ext, across, and  IR  x 10 each;     Other Standing Exercises AROM scaption x 10,       Shoulder Exercises: Pulleys   Flexion --      Shoulder Exercises: ROM/Strengthening   UBE (Upper Arm Bike) L1 x 4  min;       Shoulder Exercises: Stretch   Corner Stretch --    Corner Stretch Limitations --      Manual Therapy   Manual Therapy Joint mobilization;Soft tissue mobilization;Passive ROM    Joint Mobilization inf and post GHJ mobs gr 3 ;    Soft tissue mobilization STM/IASTm to deltoid and anterior shoulder/bicep insertion    Passive ROM L shoulder, all motions            Trigger Point Dry Needling - 05/23/20 0001    Consent Given? Yes    Education Handout Provided Previously provided    Muscles Treated Upper Quadrant Deltoid    Deltoid Response Palpable increased muscle length                  PT Short  Term Goals - 05/03/20 0900      PT SHORT TERM GOAL #1   Title Pt to be independent with intial HEP    Time 2    Period Weeks    Status New    Target Date 05/16/20      PT SHORT TERM GOAL #2   Title Pt to report decreased pain in L shoulder to 4/10    Time 2    Period Weeks    Status New    Target Date 05/16/20             PT Long Term Goals - 05/03/20 0902      PT LONG TERM GOAL #1   Title Pt to be independent with final HEP    Time 6    Period Weeks    Status New    Target Date 06/13/20      PT LONG TERM GOAL #2   Title Pt to report decreased pain in L shoulder to 0-2/10 with reaching and IADLS.    Time 6    Period Weeks    Status New    Target Date 06/13/20      PT LONG TERM GOAL #3   Title Pt to demo full AROM of L shoulder without pain, to improve ability for ADLs and IADLS.    Time 6    Period Weeks    Status New    Target Date 06/13/20      PT LONG TERM GOAL #4   Title Pt to demo improved strength of L shoulder girdle to at least 4+/5 for all motions, to improve ability for lifting, carrying, and IADLs.    Time 6    Period Weeks    Status New     Target Date 06/13/20                 Plan - 05/23/20 0831    Clinical Impression Statement Pt with improving ROM and function with L shoulder. She continues to have bothersome pain in anterior and posterior shoulder, as well as radiating pain into deltoid. Expect that deltoid pain is referred from RTC. Manual and dry needling done for deltoid, minimal twich response today. Plan to progress as able.    Examination-Activity Limitations Bathing;Reach Overhead;Carry;Dressing;Hygiene/Grooming;Lift    Examination-Participation Restrictions Meal Prep;Cleaning;Community Activity;Driving;Laundry;Yard Work    Stability/Clinical Decision Making Stable/Uncomplicated    Rehab Potential Good    PT Frequency 2x / week    PT Duration 6 weeks    PT Treatment/Interventions ADLs/Self Care Home Management;Cryotherapy;Electrical Stimulation;Iontophoresis 4mg /ml Dexamethasone;Moist Heat;DME Instruction;Neuromuscular re-education;Therapeutic exercise;Therapeutic activities;Functional mobility training;Patient/family education;Manual techniques;Taping;Dry needling;Passive range of motion;Joint Manipulations;Spinal Manipulations;Vasopneumatic Device    PT Home Exercise Plan [id number]    Consulted and Agree with Plan of Care Patient           Patient will benefit from skilled therapeutic intervention in order to improve the following deficits and impairments:  Decreased range of motion, Impaired UE functional use, Increased muscle spasms, Decreased activity tolerance, Pain, Impaired flexibility, Improper body mechanics, Decreased mobility, Decreased strength  Visit Diagnosis: Acute pain of left shoulder     Problem List Patient Active Problem List   Diagnosis Date Noted  . Paroxysmal atrial fibrillation ([place]) 06/18/2019  . Morbid obesity ([place]) 06/18/2019  . Hypertension 02/15/2017  . Headache 01/27/2017  . Bilateral swelling of feet and ankles 01/02/2017  . Routine general medical examination at a  health care facility 10/15/2016  . Otalgia of both ears 09/26/2016  .  Headache(784.0) 07/13/2014  . Arrhythmia 07/13/2014  . Dizziness 07/13/2014  . Obesity (BMI 30-39.9) 03/16/2014  . Depression 08/31/2013  . LOW BACK PAIN SYNDROME 02/18/2011  . SKIN CANCER, HX OF 02/18/2011  . BACK PAIN 02/04/2011    Lyndee Hensen, PT, DPT 8:32 AM  05/23/20    [facility] [place], [place], [address] Phone: [phone]   Fax:  [phone]  Name: KALEA PERINE MRN: [id number] Date of Birth: 05/07/1966

## 2020-05-25 ENCOUNTER — Other Ambulatory Visit: Payer: Self-pay

## 2020-05-25 ENCOUNTER — Ambulatory Visit (INDEPENDENT_AMBULATORY_CARE_PROVIDER_SITE_OTHER): Payer: 59 | Admitting: Physical Therapy

## 2020-05-25 DIAGNOSIS — M25512 Pain in left shoulder: Secondary | ICD-10-CM

## 2020-05-27 ENCOUNTER — Other Ambulatory Visit: Payer: Self-pay | Admitting: Family

## 2020-05-28 ENCOUNTER — Encounter: Payer: Self-pay | Admitting: Physical Therapy

## 2020-05-28 NOTE — Therapy (Signed)
[facility] [address] [place], [place], [address] Phone: [phone]   Fax:  [phone]  Physical Therapy Treatment  Patient Details  Name: Jodi Bailey MRN: [id number] Date of Birth: June 19, 1966 Referring Provider (PT): Lynne Leader   Encounter Date: 05/25/2020   PT End of Session - 05/28/20 2157    Visit Number 8    Number of Visits 12    Date for PT Re-Evaluation 06/13/20    Authorization Type UHC    PT Start Time 1602    PT Stop Time 1645    PT Time Calculation (min) 43 min    Activity Tolerance Patient tolerated treatment well    Behavior During Therapy [facility] for tasks assessed/performed           Past Medical History:  Diagnosis Date  . Anxiety   . Atrial fibrillation ([place])   . Chronic headaches   . Depression   . Hypertension   . Palpitations     History reviewed. No pertinent surgical history.  There were no vitals filed for this visit.   Subjective Assessment - 05/28/20 2156    Subjective Pt states overall improvements, but still has pain in anterior shoulder.    Currently in Pain? Yes    Pain Score 6     Pain Location Shoulder    Pain Orientation Left    Pain Descriptors / Indicators Aching    Pain Type Acute pain    Pain Onset More than a month ago    Pain Frequency Intermittent              [facility] PT Assessment - 05/28/20 0001      AROM   Left Shoulder Flexion 150 Degrees      PROM   Left Shoulder Flexion 150 Degrees      Strength   Left Shoulder Flexion 4-/5    Left Shoulder ABduction 4-/5    Left Shoulder Internal Rotation 4+/5    Left Shoulder External Rotation 4/5                         [facility] Adult PT Treatment/Exercise - 05/28/20 2151      Exercises   Exercises Shoulder      Shoulder Exercises: Supine   Flexion 15 reps;AAROM    Flexion Limitations cane    Other Supine Exercises Horizontal abd x 10 AROM:       Shoulder Exercises: Standing   External  Rotation Left;15 reps    Theraband Level (Shoulder External Rotation) Level 3 (Green)    Internal Rotation Left;15 reps    Theraband Level (Shoulder Internal Rotation) Level 3 (Green)    Row 20 reps    Theraband Level (Shoulder Row) Level 3 (Green)    Other Standing Exercises IR behind back,, stick, ext, across, and IR  x 10 each;     Other Standing Exercises AROM scaption x 10,       Shoulder Exercises: Pulleys   Flexion 3 minutes      Shoulder Exercises: ROM/Strengthening   UBE (Upper Arm Bike) L1 x 4  min;       Shoulder Exercises: Stretch   Corner Stretch 3 reps;30 seconds    Corner Stretch Limitations Doorway stretch 45 deg      Manual Therapy   Manual Therapy Joint mobilization;Soft tissue mobilization;Passive ROM    Joint Mobilization inf and post GHJ mobs gr 3 ;    Passive ROM  L shoulder, all motions                    PT Short Term Goals - 05/03/20 0900      PT SHORT TERM GOAL #1   Title Pt to be independent with intial HEP    Time 2    Period Weeks    Status New    Target Date 05/16/20      PT SHORT TERM GOAL #2   Title Pt to report decreased pain in L shoulder to 4/10    Time 2    Period Weeks    Status New    Target Date 05/16/20             PT Long Term Goals - 05/03/20 0902      PT LONG TERM GOAL #1   Title Pt to be independent with final HEP    Time 6    Period Weeks    Status New    Target Date 06/13/20      PT LONG TERM GOAL #2   Title Pt to report decreased pain in L shoulder to 0-2/10 with reaching and IADLS.    Time 6    Period Weeks    Status New    Target Date 06/13/20      PT LONG TERM GOAL #3   Title Pt to demo full AROM of L shoulder without pain, to improve ability for ADLs and IADLS.    Time 6    Period Weeks    Status New    Target Date 06/13/20      PT LONG TERM GOAL #4   Title Pt to demo improved strength of L shoulder girdle to at least 4+/5 for all motions, to improve ability for lifting, carrying, and  IADLs.    Time 6    Period Weeks    Status New    Target Date 06/13/20                 Plan - 05/28/20 2158    Clinical Impression Statement Pt with improved PROM and AROM today. ROM for elevation near normal, mild limitation compared to R side. She does continue to have pain that is bothersome to her, mostly in anterior shoulder.    Examination-Activity Limitations Bathing;Reach Overhead;Carry;Dressing;Hygiene/Grooming;Lift    Examination-Participation Restrictions Meal Prep;Cleaning;Community Activity;Driving;Laundry;Yard Work    Stability/Clinical Decision Making Stable/Uncomplicated    Rehab Potential Good    PT Frequency 2x / week    PT Duration 6 weeks    PT Treatment/Interventions ADLs/Self Care Home Management;Cryotherapy;Electrical Stimulation;Iontophoresis 4mg /ml Dexamethasone;Moist Heat;DME Instruction;Neuromuscular re-education;Therapeutic exercise;Therapeutic activities;Functional mobility training;Patient/family education;Manual techniques;Taping;Dry needling;Passive range of motion;Joint Manipulations;Spinal Manipulations;Vasopneumatic Device    PT Home Exercise Plan [id number]    Consulted and Agree with Plan of Care Patient           Patient will benefit from skilled therapeutic intervention in order to improve the following deficits and impairments:  Decreased range of motion, Impaired UE functional use, Increased muscle spasms, Decreased activity tolerance, Pain, Impaired flexibility, Improper body mechanics, Decreased mobility, Decreased strength  Visit Diagnosis: Acute pain of left shoulder     Problem List Patient Active Problem List   Diagnosis Date Noted  . Paroxysmal atrial fibrillation ([place]) 06/18/2019  . Morbid obesity ([place]) 06/18/2019  . Hypertension 02/15/2017  . Headache 01/27/2017  . Bilateral swelling of feet and ankles 01/02/2017  . Routine general medical examination at a health care facility 10/15/2016  .  Otalgia of both ears 09/26/2016   . Headache(784.0) 07/13/2014  . Arrhythmia 07/13/2014  . Dizziness 07/13/2014  . Obesity (BMI 30-39.9) 03/16/2014  . Depression 08/31/2013  . LOW BACK PAIN SYNDROME 02/18/2011  . SKIN CANCER, HX OF 02/18/2011  . BACK PAIN 02/04/2011    Lyndee Hensen, PT, DPT 10:01 PM  05/28/20    [facility] [place], [place], [address] Phone: [phone]   Fax:  [phone]  Name: Jodi Bailey MRN: [id number] Date of Birth: 1966-02-05

## 2020-05-29 ENCOUNTER — Other Ambulatory Visit: Payer: Self-pay

## 2020-05-29 ENCOUNTER — Ambulatory Visit (INDEPENDENT_AMBULATORY_CARE_PROVIDER_SITE_OTHER): Payer: 59 | Admitting: Physical Therapy

## 2020-05-29 DIAGNOSIS — M25512 Pain in left shoulder: Secondary | ICD-10-CM

## 2020-05-30 ENCOUNTER — Encounter: Payer: Self-pay | Admitting: Physical Therapy

## 2020-05-30 NOTE — Therapy (Signed)
[facility] [address] [place], [place], [address] Phone: [phone]   Fax:  [phone]  Physical Therapy Treatment  Patient Details  Name: Jodi Bailey MRN: [id number] Date of Birth: 1966-07-27 Referring Provider (PT): Lynne Leader   Encounter Date: 05/29/2020   PT End of Session - 05/30/20 0901    Visit Number 9    Number of Visits 12    Date for PT Re-Evaluation 06/13/20    Authorization Type UHC    PT Start Time 1602    PT Stop Time 1644    PT Time Calculation (min) 42 min    Activity Tolerance Patient tolerated treatment well    Behavior During Therapy [facility] for tasks assessed/performed           Past Medical History:  Diagnosis Date  . Anxiety   . Atrial fibrillation ([place])   . Chronic headaches   . Depression   . Hypertension   . Palpitations     History reviewed. No pertinent surgical history.  There were no vitals filed for this visit.   Subjective Assessment - 05/30/20 0900    Subjective Pt states decreasing soreness in anterior shoulder over the weekend.    Currently in Pain? Yes    Pain Score 3     Pain Location Shoulder    Pain Orientation Left    Pain Descriptors / Indicators Aching    Pain Type Acute pain    Pain Onset More than a month ago    Pain Frequency Intermittent                             [facility] Adult PT Treatment/Exercise - 05/30/20 0001      Exercises   Exercises Shoulder      Shoulder Exercises: Supine   External Rotation 15 reps;AAROM    External Rotation Limitations cane 45 and 90 deg    Flexion 15 reps;AROM    Flexion Limitations --    Other Supine Exercises --      Shoulder Exercises: Standing   External Rotation Left;15 reps    Theraband Level (Shoulder External Rotation) Level 3 (Green)    Internal Rotation Left;15 reps    Theraband Level (Shoulder Internal Rotation) Level 3 (Green)    Row 20 reps    Theraband Level (Shoulder Row) Level 3  (Green)    Other Standing Exercises IR behind back,, stick, ext, across, and IR  x 10 each;     Other Standing Exercises AROM flexion x 15, Abd x 15;       Shoulder Exercises: Pulleys   Flexion 3 minutes      Shoulder Exercises: ROM/Strengthening   UBE (Upper Arm Bike) L1 x 4  min;       Shoulder Exercises: Stretch   Corner Stretch 3 reps;30 seconds    Corner Stretch Limitations Doorway stretch 45 deg      Manual Therapy   Manual Therapy Joint mobilization;Soft tissue mobilization;Passive ROM    Joint Mobilization inf and post GHJ mobs gr 3 ;    Soft tissue mobilization sub scap release     Passive ROM L shoulder, all motions                    PT Short Term Goals - 05/03/20 0900      PT SHORT TERM GOAL #1   Title Pt to be independent with intial HEP  Time 2    Period Weeks    Status New    Target Date 05/16/20      PT SHORT TERM GOAL #2   Title Pt to report decreased pain in L shoulder to 4/10    Time 2    Period Weeks    Status New    Target Date 05/16/20             PT Long Term Goals - 05/03/20 0902      PT LONG TERM GOAL #1   Title Pt to be independent with final HEP    Time 6    Period Weeks    Status New    Target Date 06/13/20      PT LONG TERM GOAL #2   Title Pt to report decreased pain in L shoulder to 0-2/10 with reaching and IADLS.    Time 6    Period Weeks    Status New    Target Date 06/13/20      PT LONG TERM GOAL #3   Title Pt to demo full AROM of L shoulder without pain, to improve ability for ADLs and IADLS.    Time 6    Period Weeks    Status New    Target Date 06/13/20      PT LONG TERM GOAL #4   Title Pt to demo improved strength of L shoulder girdle to at least 4+/5 for all motions, to improve ability for lifting, carrying, and IADLs.    Time 6    Period Weeks    Status New    Target Date 06/13/20                 Plan - 05/30/20 0902    Clinical Impression Statement Pt with improving PROM and AROM.  Still limited with behing the back IR (but improving from last week). End range ER painful, and causing referred pain into arm, but ROM for this also improving. Pt with improving function, ROM, and ability for use of L arm. Still having most soreness in anterior, bicep region and pain with ER. Pt progressing well, and will benefit from continued care.    Examination-Activity Limitations Bathing;Reach Overhead;Carry;Dressing;Hygiene/Grooming;Lift    Examination-Participation Restrictions Meal Prep;Cleaning;Community Activity;Driving;Laundry;Yard Work    Stability/Clinical Decision Making Stable/Uncomplicated    Rehab Potential Good    PT Frequency 2x / week    PT Duration 6 weeks    PT Treatment/Interventions ADLs/Self Care Home Management;Cryotherapy;Electrical Stimulation;Iontophoresis 4mg /ml Dexamethasone;Moist Heat;DME Instruction;Neuromuscular re-education;Therapeutic exercise;Therapeutic activities;Functional mobility training;Patient/family education;Manual techniques;Taping;Dry needling;Passive range of motion;Joint Manipulations;Spinal Manipulations;Vasopneumatic Device    PT Home Exercise Plan [id number]    Consulted and Agree with Plan of Care Patient           Patient will benefit from skilled therapeutic intervention in order to improve the following deficits and impairments:  Decreased range of motion, Impaired UE functional use, Increased muscle spasms, Decreased activity tolerance, Pain, Impaired flexibility, Improper body mechanics, Decreased mobility, Decreased strength  Visit Diagnosis: Acute pain of left shoulder     Problem List Patient Active Problem List   Diagnosis Date Noted  . Paroxysmal atrial fibrillation ([place]) 06/18/2019  . Morbid obesity ([place]) 06/18/2019  . Hypertension 02/15/2017  . Headache 01/27/2017  . Bilateral swelling of feet and ankles 01/02/2017  . Routine general medical examination at a health care facility 10/15/2016  . Otalgia of both ears  09/26/2016  . Headache(784.0) 07/13/2014  . Arrhythmia 07/13/2014  . Dizziness 07/13/2014  .  Obesity (BMI 30-39.9) 03/16/2014  . Depression 08/31/2013  . LOW BACK PAIN SYNDROME 02/18/2011  . SKIN CANCER, HX OF 02/18/2011  . BACK PAIN 02/04/2011    Lyndee Hensen, PT, DPT 9:06 AM  05/30/20    [facility] [place], [place], [address] Phone: [phone]   Fax:  [phone]  Name: Jodi Bailey MRN: [id number] Date of Birth: Mar 22, 1966

## 2020-05-31 ENCOUNTER — Encounter: Payer: 59 | Admitting: Physical Therapy

## 2020-06-01 ENCOUNTER — Ambulatory Visit: Payer: Self-pay

## 2020-06-01 ENCOUNTER — Other Ambulatory Visit: Payer: Self-pay

## 2020-06-01 ENCOUNTER — Encounter: Payer: Self-pay | Admitting: Family Medicine

## 2020-06-01 ENCOUNTER — Ambulatory Visit (INDEPENDENT_AMBULATORY_CARE_PROVIDER_SITE_OTHER): Payer: 59 | Admitting: Family Medicine

## 2020-06-01 VITALS — BP 116/70 | HR 81 | Ht 67.0 in | Wt 262.2 lb

## 2020-06-01 DIAGNOSIS — M25512 Pain in left shoulder: Secondary | ICD-10-CM | POA: Diagnosis not present

## 2020-06-01 NOTE — Patient Instructions (Signed)
Thank you for coming in today. Please continue PT and home exercises.  Next step is probably an injection.  Recheck in about 1 month.

## 2020-06-01 NOTE — Progress Notes (Signed)
   I, Wendy Poet, LAT, ATC, am serving as scribe for Dr. Lynne Leader.  Jodi Bailey is a 54 y.o. female who presents to [facility] today for f/u of L shoulder pain after falling in early May, landing on her L hand and L shoulder.  She was last seen by Dr. Georgina Snell on 04/21/20 and was provided a HEP focusing on RC and peri-scapular strengthening.  She was also referred to outpatient PT and has completed 9 visits.  Since her last visit w/ Dr. Georgina Snell, pt reports that her L shoulder is doing much better except for her functional IR which is still fairly limited and painful.  She has been doing dry needling at PT.  She rates her improvement at 80%.  Diagnostic testing: L humerus XR- 04/03/20   Pertinent review of systems: No fevers or chills  Relevant historical information: Hypertension, A. fib   Exam:  BP 116/70 (BP Location: Left Arm, Patient Position: Sitting, Cuff Size: Large)   Pulse 81   Ht 5\' 7"  (1.702 m)   Wt 262 lb 3.2 oz (118.9 kg)   LMP 02/03/2017   SpO2 95%   BMI 41.07 kg/m  General: Well Developed, well nourished, and in no acute distress.   MSK: Left shoulder normal-appearing nontender. Range of motion abduction full however painful arc.  Internal rotation limited to lumbar spine.  External rotation full. Strength 4/5 abduction and external rotation.  5/5 internal rotation. Positive Hawkins and Neer's test positive empty can test. Negative Yergason's and speeds test.    Assessment and Plan: 54 y.o. female with left shoulder pain improved with physical therapy and home exercise program but not resolved.  Still having a fair amount of pain but dramatically improved from previous.  Discussed options.  She would very much like to avoid injection which is reasonable.  Plan to continue PT and home exercise program.  Recheck in about a month.  If not better at that point subacromial injection is certainly reasonable.     Discussed warning  signs or symptoms. Please see discharge instructions. Patient expresses understanding.   The above documentation has been reviewed and is accurate and complete Lynne Leader, M.D.

## 2020-06-07 ENCOUNTER — Other Ambulatory Visit: Payer: Self-pay

## 2020-06-07 ENCOUNTER — Encounter: Payer: Self-pay | Admitting: Physical Therapy

## 2020-06-07 ENCOUNTER — Ambulatory Visit (INDEPENDENT_AMBULATORY_CARE_PROVIDER_SITE_OTHER): Payer: 59 | Admitting: Physical Therapy

## 2020-06-07 DIAGNOSIS — M25512 Pain in left shoulder: Secondary | ICD-10-CM | POA: Diagnosis not present

## 2020-06-07 NOTE — Therapy (Signed)
[facility] [address] [place], [place], [address] Phone: [phone]   Fax:  [phone]  Physical Therapy Treatment/Re-Cert   Patient Details  Name: Jodi Bailey MRN: [id number] Date of Birth: 04/08/1966 Referring Provider (PT): Evan Corey   Encounter Date: 06/07/2020   PT End of Session - 06/07/20 1645    Visit Number 10    Number of Visits 12    Date for PT Re-Evaluation 07/19/20    Authorization Type UHC    PT Start Time 1602    PT Stop Time 1640    PT Time Calculation (min) 38 min    Activity Tolerance Patient tolerated treatment well    Behavior During Therapy WFL for tasks assessed/performed           Past Medical History:  Diagnosis Date  . Anxiety   . Atrial fibrillation (HCC)   . Chronic headaches   . Depression   . Hypertension   . Palpitations     History reviewed. No pertinent surgical history.  There were no vitals filed for this visit.   Subjective Assessment - 06/07/20 1643    Subjective Pt states improving soreness and function overall. Had f/u with MD, suggested continued PT. Pt declinced injection, thinks shoulder is improving. Still has increased pain with certain motions, and is limited with reaching, lifting, and higher level activities/ IADLS.    Limitations Lifting;House hold activities    Patient Stated Goals Decreased pain.    Currently in Pain? Yes    Pain Score 3     Pain Location Shoulder    Pain Orientation Left    Pain Descriptors / Indicators Aching    Pain Type Acute pain    Pain Onset More than a month ago    Pain Frequency Intermittent              [facility] PT Assessment - 06/07/20 0001      AROM   Overall AROM Comments IR behind back: to t9/10/much improved     Left Shoulder Internal Rotation 65 Degrees    Left Shoulder External Rotation --   wnl     PROM   Left Shoulder Flexion 165 Degrees    Left Shoulder ABduction 160 Degrees    Left Shoulder Internal  Rotation --   wnl   Left Shoulder External Rotation 65 Degrees      Strength   Left Shoulder Flexion 4/5    Left Shoulder ABduction 4/5    Left Shoulder Internal Rotation 4+/5    Left Shoulder External Rotation 4/5                         [facility] Adult PT Treatment/Exercise - 06/07/20 0001      Shoulder Exercises: Supine   External Rotation 20 reps    External Rotation Weight (lbs) 2    Flexion 15 reps;AROM    Other Supine Exercises ER at 90 deg for AROM x 15;       Shoulder Exercises: Sidelying   ABduction 15 reps    ABduction Limitations 90-160 deg /partial range     Other Sidelying Exercises horiz abd x15;       Shoulder Exercises: Standing   External Rotation Left;15 reps    Theraband Level (Shoulder External Rotation) Level 3 (Green)    Internal Rotation Left;15 reps    Theraband Level (Shoulder Internal Rotation) Level 3 (Green)    Row 20 reps      Theraband Level (Shoulder Row) Level 3 (Green)    Other Standing Exercises IR behind back,, stick, ext, across, and IR  x 10 each;     Other Standing Exercises AROM flexion x 15, Abd x 15;       Shoulder Exercises: ROM/Strengthening   UBE (Upper Arm Bike) L1 x 3 min;       Shoulder Exercises: Stretch   Corner Stretch 3 reps;30 seconds    Corner Stretch Limitations Doorway stretch 45 and 90 deg      Manual Therapy   Joint Mobilization inf and post GHJ mobs gr 3 ;    Passive ROM L shoulder, all motions                  PT Education - 06/07/20 1645    Education Details HEP updated            PT Short Term Goals - 06/07/20 1645      PT SHORT TERM GOAL #1   Title Pt to be independent with intial HEP    Time 2    Period Weeks    Status Achieved    Target Date 05/16/20      PT SHORT TERM GOAL #2   Title Pt to report decreased pain in L shoulder to 4/10    Time 2    Period Weeks    Status Achieved    Target Date 05/16/20             PT Long Term Goals - 06/07/20 1645      PT LONG  TERM GOAL #1   Title Pt to be independent with final HEP    Time 6    Period Weeks    Status Partially Met      PT LONG TERM GOAL #2   Title Pt to report decreased pain in L shoulder to 0-2/10 with reaching and IADLS.    Time 6    Period Weeks    Status Partially Met      PT LONG TERM GOAL #3   Title Pt to demo full AROM of L shoulder without pain, to improve ability for ADLs and IADLS.    Time 6    Period Weeks    Status Partially Met      PT LONG TERM GOAL #4   Title Pt to demo improved strength of L shoulder girdle to at least 4+/5 for all motions, to improve ability for lifting, carrying, and IADLs.    Time 6    Period Weeks    Status Partially Met                 Plan - 06/07/20 1646    Clinical Impression Statement Pt progressing well with PROM and AROM. Has improved motion each week, as well as improving pain. IR behind back improved today from last week. Pt still has pain at end ranged of ER, and pain with full Abd ROM, reaching, lifing, and IADLs. Pt to benefit from contnued care to improve pain, AROM, strenth and to meet LTGs.    Examination-Activity Limitations Bathing;Reach Overhead;Carry;Dressing;Hygiene/Grooming;Lift    Examination-Participation Restrictions Meal Prep;Cleaning;Community Activity;Driving;Laundry;Yard Work    Stability/Clinical Decision Making Stable/Uncomplicated    Rehab Potential Good    PT Frequency 2x / week    PT Duration 6 weeks    PT Treatment/Interventions ADLs/Self Care Home Management;Cryotherapy;Electrical Stimulation;Iontophoresis 66m/ml Dexamethasone;Moist Heat;DME Instruction;Neuromuscular re-education;Therapeutic exercise;Therapeutic activities;Functional mobility training;Patient/family education;Manual techniques;Taping;Dry needling;Passive range of motion;Joint Manipulations;Spinal Manipulations;Vasopneumatic  Device    PT Home Exercise Plan [id number]    Consulted and Agree with Plan of Care Patient           Patient will  benefit from skilled therapeutic intervention in order to improve the following deficits and impairments:  Decreased range of motion, Impaired UE functional use, Increased muscle spasms, Decreased activity tolerance, Pain, Impaired flexibility, Improper body mechanics, Decreased mobility, Decreased strength  Visit Diagnosis: Acute pain of left shoulder     Problem List Patient Active Problem List   Diagnosis Date Noted  . Paroxysmal atrial fibrillation ([place]) 06/18/2019  . Morbid obesity ([place]) 06/18/2019  . Hypertension 02/15/2017  . Headache 01/27/2017  . Bilateral swelling of feet and ankles 01/02/2017  . Routine general medical examination at a health care facility 10/15/2016  . Otalgia of both ears 09/26/2016  . Headache(784.0) 07/13/2014  . Arrhythmia 07/13/2014  . Dizziness 07/13/2014  . Obesity (BMI 30-39.9) 03/16/2014  . Depression 08/31/2013  . LOW BACK PAIN SYNDROME 02/18/2011  . SKIN CANCER, HX OF 02/18/2011  . BACK PAIN 02/04/2011    Lyndee Hensen, PT, DPT 4:48 PM  06/07/20    [facility] [place], [place], [address] Phone: [phone]   Fax:  [phone]  Name: Jodi Bailey MRN: [id number] Date of Birth: January 15, 1966

## 2020-06-13 ENCOUNTER — Other Ambulatory Visit: Payer: Self-pay

## 2020-06-13 ENCOUNTER — Encounter: Payer: Self-pay | Admitting: Physical Therapy

## 2020-06-13 ENCOUNTER — Ambulatory Visit (INDEPENDENT_AMBULATORY_CARE_PROVIDER_SITE_OTHER): Payer: 59 | Admitting: Physical Therapy

## 2020-06-13 DIAGNOSIS — M25512 Pain in left shoulder: Secondary | ICD-10-CM | POA: Diagnosis not present

## 2020-06-13 NOTE — Therapy (Signed)
[facility] [address] [place], [place], [address] Phone: [phone]   Fax:  [phone]  Physical Therapy Treatment  Patient Details  Name: Jodi Bailey MRN: [id number] Date of Birth: [birth year] Referring Provider (PT): Lynne Leader   Encounter Date: 06/13/2020   PT End of Session - 06/13/20 0810    Visit Number 11    Number of Visits 20    Date for PT Re-Evaluation 07/19/20    Authorization Type UHC    PT Start Time 0803    PT Stop Time 0845    PT Time Calculation (min) 42 min    Activity Tolerance Patient tolerated treatment well    Behavior During Therapy [facility] for tasks assessed/performed           Past Medical History:  Diagnosis Date  . Anxiety   . Atrial fibrillation ([place])   . Chronic headaches   . Depression   . Hypertension   . Palpitations     History reviewed. No pertinent surgical history.  There were no vitals filed for this visit.   Subjective Assessment - 06/13/20 0806    Subjective Pt states mild improvments with movement and less pain.    Currently in Pain? Yes    Pain Score 2     Pain Location Shoulder    Pain Orientation Left    Pain Descriptors / Indicators Aching    Pain Type Acute pain    Pain Onset More than a month ago    Pain Frequency Intermittent    Aggravating Factors  reaching/elevation, behind back.                             [facility] Adult PT Treatment/Exercise - 06/13/20 0811      Shoulder Exercises: Supine   External Rotation --    External Rotation Weight (lbs) --    Flexion --    Other Supine Exercises --      Shoulder Exercises: Sidelying   ABduction --    ABduction Limitations --    Other Sidelying Exercises --      Shoulder Exercises: Standing   External Rotation Left;15 reps    Theraband Level (Shoulder External Rotation) Level 3 (Green)    Internal Rotation Left;15 reps    Theraband Level (Shoulder Internal Rotation) Level 3 (Green)     Row 20 reps    Theraband Level (Shoulder Row) Level 4 (Blue)    Other Standing Exercises IR stretch behind back x 10 ea;     Other Standing Exercises AROM flexion x 15,  scaption x15,  Abd x 15;       Shoulder Exercises: ROM/Strengthening   UBE (Upper Arm Bike) L1 x 4 min;       Shoulder Exercises: Stretch   Corner Stretch 3 reps;30 seconds    Corner Stretch Limitations Doorway stretch 45 and 90 deg    Other Shoulder Stretches Supine pec stretch x 2 min      Shoulder Exercises: Body Blade   Flexion 1 rep;30 seconds    ABduction 1 rep;30 seconds    External Rotation 1 rep;30 seconds      Modalities   Modalities Iontophoresis      Iontophoresis   Type of Iontophoresis Dexamethasone    Location L anterior shoulder    Time 4 hr patch      Manual Therapy   Joint Mobilization inf and post GHJ mobs gr 3 ;  Soft tissue mobilization DTM to anterior shoulder and bicep insertion     Passive ROM L shoulder, all motions                    PT Short Term Goals - 06/07/20 1645      PT SHORT TERM GOAL #1   Title Pt to be independent with intial HEP    Time 2    Period Weeks    Status Achieved    Target Date 05/16/20      PT SHORT TERM GOAL #2   Title Pt to report decreased pain in L shoulder to 4/10    Time 2    Period Weeks    Status Achieved    Target Date 05/16/20             PT Long Term Goals - 06/07/20 1645      PT LONG TERM GOAL #1   Title Pt to be independent with final HEP    Time 6    Period Weeks    Status Partially Met      PT LONG TERM GOAL #2   Title Pt to report decreased pain in L shoulder to 0-2/10 with reaching and IADLS.    Time 6    Period Weeks    Status Partially Met      PT LONG TERM GOAL #3   Title Pt to demo full AROM of L shoulder without pain, to improve ability for ADLs and IADLS.    Time 6    Period Weeks    Status Partially Met      PT LONG TERM GOAL #4   Title Pt to demo improved strength of L shoulder girdle to at  least 4+/5 for all motions, to improve ability for lifting, carrying, and IADLs.    Time 6    Period Weeks    Status Partially Met                 Plan - 06/13/20 1045    Clinical Impression Statement Pt with improving ability for AROM and elevation. Continues to have pain in anterior shouder, mostly with abduction motions. Challenged with increased strength and stability today. Plan to progress as tolerated.    Examination-Activity Limitations Bathing;Reach Overhead;Carry;Dressing;Hygiene/Grooming;Lift    Examination-Participation Restrictions Meal Prep;Cleaning;Community Activity;Driving;Laundry;Yard Work    Stability/Clinical Decision Making Stable/Uncomplicated    Rehab Potential Good    PT Frequency 2x / week    PT Duration 6 weeks    PT Treatment/Interventions ADLs/Self Care Home Management;Cryotherapy;Electrical Stimulation;Iontophoresis '4mg'$ /ml Dexamethasone;Moist Heat;DME Instruction;Neuromuscular re-education;Therapeutic exercise;Therapeutic activities;Functional mobility training;Patient/family education;Manual techniques;Taping;Dry needling;Passive range of motion;Joint Manipulations;Spinal Manipulations;Vasopneumatic Device    PT Home Exercise Plan [id number]    Consulted and Agree with Plan of Care Patient           Patient will benefit from skilled therapeutic intervention in order to improve the following deficits and impairments:  Decreased range of motion, Impaired UE functional use, Increased muscle spasms, Decreased activity tolerance, Pain, Impaired flexibility, Improper body mechanics, Decreased mobility, Decreased strength  Visit Diagnosis: Acute pain of left shoulder     Problem List Patient Active Problem List   Diagnosis Date Noted  . Paroxysmal atrial fibrillation ([place]) 06/18/2019  . Morbid obesity ([place]) 06/18/2019  . Hypertension 02/15/2017  . Headache 01/27/2017  . Bilateral swelling of feet and ankles 01/02/2017  . Routine general medical  examination at a health care facility 10/15/2016  . Otalgia of both ears  09/26/2016  . Headache(784.0) 07/13/2014  . Arrhythmia 07/13/2014  . Dizziness 07/13/2014  . Obesity (BMI 30-39.9) 03/16/2014  . Depression 08/31/2013  . LOW BACK PAIN SYNDROME 02/18/2011  . SKIN CANCER, HX OF 02/18/2011  . BACK PAIN 02/04/2011   Lyndee Hensen, PT, DPT 10:47 AM  06/13/20     [facility] [place], [place], [address] Phone: [phone]   Fax:  [phone]  Name: Jodi Bailey MRN: [id number] Date of Birth: 01/23/1966

## 2020-06-15 ENCOUNTER — Encounter: Payer: Self-pay | Admitting: Physical Therapy

## 2020-06-15 ENCOUNTER — Ambulatory Visit (INDEPENDENT_AMBULATORY_CARE_PROVIDER_SITE_OTHER): Payer: 59 | Admitting: Physical Therapy

## 2020-06-15 DIAGNOSIS — M25512 Pain in left shoulder: Secondary | ICD-10-CM | POA: Diagnosis not present

## 2020-06-15 NOTE — Therapy (Signed)
[facility] [address] [place], [place], [address] Phone: [phone]   Fax:  [phone]  Physical Therapy Treatment  Patient Details  Name: Jodi Bailey MRN: [id number] Date of Birth: 1965-12-19 Referring Provider (PT): Lynne Leader   Encounter Date: 06/15/2020   PT End of Session - 06/15/20 0940    Visit Number 12    Number of Visits 20    Date for PT Re-Evaluation 07/19/20    Authorization Type UHC    PT Start Time 0936    PT Stop Time 1015    PT Time Calculation (min) 39 min    Activity Tolerance Patient tolerated treatment well    Behavior During Therapy [facility] for tasks assessed/performed           Past Medical History:  Diagnosis Date  . Anxiety   . Atrial fibrillation ([place])   . Chronic headaches   . Depression   . Hypertension   . Palpitations     History reviewed. No pertinent surgical history.  There were no vitals filed for this visit.   Subjective Assessment - 06/15/20 0939    Subjective Pt doing well today.    Currently in Pain? Yes    Pain Score 2     Pain Location Shoulder    Pain Orientation Left    Pain Descriptors / Indicators Aching    Pain Type Acute pain    Pain Onset More than a month ago    Pain Frequency Intermittent                             [facility] Adult PT Treatment/Exercise - 06/15/20 0001      Shoulder Exercises: Supine   Other Supine Exercises Chest press 3 ls  x 15 ; flys 2 lb, limited ROM x 15;       Shoulder Exercises: Standing   External Rotation Left;20 reps    Theraband Level (Shoulder External Rotation) Level 3 (Green)    Internal Rotation Left;20 reps    Theraband Level (Shoulder Internal Rotation) Level 3 (Green)    Row 20 reps    Theraband Level (Shoulder Row) Level 4 (Blue)    Other Standing Exercises IR stretch behind back x 10 ea;     Other Standing Exercises AROM flexion x 15,  scaption x15,  Abd x 15; wall angel x 10;       Shoulder  Exercises: ROM/Strengthening   UBE (Upper Arm Bike) L1 x 3 min;     Wall Pushups 20 reps      Shoulder Exercises: Stretch   Corner Stretch 3 reps;30 seconds    Corner Stretch Limitations Doorway stretch 45 and 90 deg    Other Shoulder Stretches --      Shoulder Exercises: Body Blade   Flexion --    ABduction --    External Rotation --      Modalities   Modalities Iontophoresis      Iontophoresis   Type of Iontophoresis --    Location --    Time --      Manual Therapy   Joint Mobilization inf and post GHJ mobs gr 3 ;    Soft tissue mobilization --    Passive ROM L shoulder, all motions                    PT Short Term Goals - 06/07/20 1645  PT SHORT TERM GOAL #1   Title Pt to be independent with intial HEP    Time 2    Period Weeks    Status Achieved    Target Date 05/16/20      PT SHORT TERM GOAL #2   Title Pt to report decreased pain in L shoulder to 4/10    Time 2    Period Weeks    Status Achieved    Target Date 05/16/20             PT Long Term Goals - 06/07/20 1645      PT LONG TERM GOAL #1   Title Pt to be independent with final HEP    Time 6    Period Weeks    Status Partially Met      PT LONG TERM GOAL #2   Title Pt to report decreased pain in L shoulder to 0-2/10 with reaching and IADLS.    Time 6    Period Weeks    Status Partially Met      PT LONG TERM GOAL #3   Title Pt to demo full AROM of L shoulder without pain, to improve ability for ADLs and IADLS.    Time 6    Period Weeks    Status Partially Met      PT LONG TERM GOAL #4   Title Pt to demo improved strength of L shoulder girdle to at least 4+/5 for all motions, to improve ability for lifting, carrying, and IADLs.    Time 6    Period Weeks    Status Partially Met                 Plan - 06/15/20 1020    Clinical Impression Statement Pt with improving ability for AROM, still has soreness wtih ABd repeated motions, ER at end range, and mild weakness  with elevation. Pt benefitting from progressive strengthening and manual for restoring full pain free ER. Likely d/c to HEP in next 2 weeks if pain continues to improve.    Examination-Activity Limitations Bathing;Reach Overhead;Carry;Dressing;Hygiene/Grooming;Lift    Examination-Participation Restrictions Meal Prep;Cleaning;Community Activity;Driving;Laundry;Yard Work    Stability/Clinical Decision Making Stable/Uncomplicated    Rehab Potential Good    PT Frequency 2x / week    PT Duration 6 weeks    PT Treatment/Interventions ADLs/Self Care Home Management;Cryotherapy;Electrical Stimulation;Iontophoresis 55m/ml Dexamethasone;Moist Heat;DME Instruction;Neuromuscular re-education;Therapeutic exercise;Therapeutic activities;Functional mobility training;Patient/family education;Manual techniques;Taping;Dry needling;Passive range of motion;Joint Manipulations;Spinal Manipulations;Vasopneumatic Device    PT Home Exercise Plan [id number]    Consulted and Agree with Plan of Care Patient           Patient will benefit from skilled therapeutic intervention in order to improve the following deficits and impairments:  Decreased range of motion, Impaired UE functional use, Increased muscle spasms, Decreased activity tolerance, Pain, Impaired flexibility, Improper body mechanics, Decreased mobility, Decreased strength  Visit Diagnosis: Acute pain of left shoulder     Problem List Patient Active Problem List   Diagnosis Date Noted  . Paroxysmal atrial fibrillation ([place] 06/18/2019  . Morbid obesity ([place] 06/18/2019  . Hypertension 02/15/2017  . Headache 01/27/2017  . Bilateral swelling of feet and ankles 01/02/2017  . Routine general medical examination at a health care facility 10/15/2016  . Otalgia of both ears 09/26/2016  . Headache(784.0) 07/13/2014  . Arrhythmia 07/13/2014  . Dizziness 07/13/2014  . Obesity (BMI 30-39.9) 03/16/2014  . Depression 08/31/2013  . LOW BACK PAIN SYNDROME  02/18/2011  . SKIN CANCER,  HX OF 02/18/2011  . BACK PAIN 02/04/2011    Lyndee Hensen, PT, DPT 10:22 AM  06/15/20    [facility] [place], [place], [address] Phone: [phone]   Fax:  [phone]  Name: Jodi Bailey MRN: [id number] Date of Birth: 21-May-1966

## 2020-06-20 ENCOUNTER — Ambulatory Visit (INDEPENDENT_AMBULATORY_CARE_PROVIDER_SITE_OTHER): Payer: 59 | Admitting: Physical Therapy

## 2020-06-20 ENCOUNTER — Encounter: Payer: Self-pay | Admitting: Physical Therapy

## 2020-06-20 ENCOUNTER — Other Ambulatory Visit: Payer: Self-pay

## 2020-06-20 DIAGNOSIS — M25512 Pain in left shoulder: Secondary | ICD-10-CM

## 2020-06-20 NOTE — Therapy (Signed)
[facility] [address] [place], [place], [address] Phone: [phone]   Fax:  [phone]  Physical Therapy Treatment  Patient Details  Name: Jodi Bailey MRN: [id number] Date of Birth: Jul 31, 1966 Referring Provider (PT): Lynne Leader   Encounter Date: 06/20/2020   PT End of Session - 06/20/20 0841    Visit Number 13    Number of Visits 20    Date for PT Re-Evaluation 07/19/20    Authorization Type UHC    PT Start Time 0845    PT Stop Time 0931    PT Time Calculation (min) 46 min    Activity Tolerance Patient tolerated treatment well    Behavior During Therapy [facility] for tasks assessed/performed           Past Medical History:  Diagnosis Date  . Anxiety   . Atrial fibrillation ([place])   . Chronic headaches   . Depression   . Hypertension   . Palpitations     History reviewed. No pertinent surgical history.  There were no vitals filed for this visit.   Subjective Assessment - 06/20/20 0845    Subjective It's doing better. Still have a constant ache    Patient Stated Goals Decreased pain.    Currently in Pain? Yes    Pain Score 2     Pain Location Shoulder    Pain Orientation Left    Pain Descriptors / Indicators Aching              [facility] PT Assessment - 06/20/20 0001      AROM   Left Shoulder Internal Rotation 70 Degrees                         [facility] Adult PT Treatment/Exercise - 06/20/20 0001      Shoulder Exercises: Supine   Flexion 10 reps    Flexion Limitations hooklying abs engaged    ABduction 10 reps    ABduction Limitations hooklying abs engaged angels for lat stretch      Shoulder Exercises: Standing   External Rotation Left;20 reps    Theraband Level (Shoulder External Rotation) Level 3 (Green)    Internal Rotation Left;20 reps    Theraband Level (Shoulder Internal Rotation) Level 3 (Green)    Extension 20 reps    Theraband Level (Shoulder Extension) Level 4 (Blue)     Row 20 reps    Theraband Level (Shoulder Row) Level 4 (Blue)    Other Standing Exercises IR stretch behind back x 10 ea;  wall walks with green band x 5 up and down bil    Other Standing Exercises AROM 1# flexion x 15,  scaption x15,  Abd x 15; wall angel x 10;       Shoulder Exercises: ROM/Strengthening   UBE (Upper Arm Bike) L1 x 3 min;     Wall Pushups 20 reps      Shoulder Exercises: Stretch   Corner Stretch 3 reps;30 seconds    Corner Stretch Limitations Doorway stretch 45 and 90 deg    Other Shoulder Stretches left SDLY sleeper stretch 2x30 sec      Manual Therapy   Manual Therapy Passive ROM;Joint mobilization    Joint Mobilization for relaxation during PROM    Passive ROM IR/flex/ER                    PT Short Term Goals - 06/07/20 1645  PT SHORT TERM GOAL #1   Title Pt to be independent with intial HEP    Time 2    Period Weeks    Status Achieved    Target Date 05/16/20      PT SHORT TERM GOAL #2   Title Pt to report decreased pain in L shoulder to 4/10    Time 2    Period Weeks    Status Achieved    Target Date 05/16/20             PT Long Term Goals - 06/07/20 1645      PT LONG TERM GOAL #1   Title Pt to be independent with final HEP    Time 6    Period Weeks    Status Partially Met      PT LONG TERM GOAL #2   Title Pt to report decreased pain in L shoulder to 0-2/10 with reaching and IADLS.    Time 6    Period Weeks    Status Partially Met      PT LONG TERM GOAL #3   Title Pt to demo full AROM of L shoulder without pain, to improve ability for ADLs and IADLS.    Time 6    Period Weeks    Status Partially Met      PT LONG TERM GOAL #4   Title Pt to demo improved strength of L shoulder girdle to at least 4+/5 for all motions, to improve ability for lifting, carrying, and IADLs.    Time 6    Period Weeks    Status Partially Met                 Plan - 06/20/20 1125    Clinical Impression Statement Patient continuing  to improve with ROM. Tolerated light weight with flex/ABD and scaption today. Sill feeling intermittent catch with ER and ABD. May benefit from DN to subscapularis.    PT Frequency 2x / week    PT Duration 6 weeks    PT Treatment/Interventions ADLs/Self Care Home Management;Cryotherapy;Electrical Stimulation;Iontophoresis '4mg'$ /ml Dexamethasone;Moist Heat;DME Instruction;Neuromuscular re-education;Therapeutic exercise;Therapeutic activities;Functional mobility training;Patient/family education;Manual techniques;Taping;Dry needling;Passive range of motion;Joint Manipulations;Spinal Manipulations;Vasopneumatic Device    PT Next Visit Plan assess goals in prep for d/c    PT Home Exercise Plan [email or web address]           Patient will benefit from skilled therapeutic intervention in order to improve the following deficits and impairments:  Decreased range of motion, Impaired UE functional use, Increased muscle spasms, Decreased activity tolerance, Pain, Impaired flexibility, Improper body mechanics, Decreased mobility, Decreased strength  Visit Diagnosis: Acute pain of left shoulder     Problem List Patient Active Problem List   Diagnosis Date Noted  . Paroxysmal atrial fibrillation ([place]) 06/18/2019  . Morbid obesity ([place]) 06/18/2019  . Hypertension 02/15/2017  . Headache 01/27/2017  . Bilateral swelling of feet and ankles 01/02/2017  . Routine general medical examination at a health care facility 10/15/2016  . Otalgia of both ears 09/26/2016  . Headache(784.0) 07/13/2014  . Arrhythmia 07/13/2014  . Dizziness 07/13/2014  . Obesity (BMI 30-39.9) 03/16/2014  . Depression 08/31/2013  . LOW BACK PAIN SYNDROME 02/18/2011  . SKIN CANCER, HX OF 02/18/2011  . BACK PAIN 02/04/2011    Madelyn Flavors PT 06/20/2020, 11:30 AM  [place] [address] [place], [place], [address] Phone: [phone]   Fax:  [phone]  Name: Jodi Bailey MRN:  [id number] Date of  Birth: [birth year]

## 2020-06-26 ENCOUNTER — Other Ambulatory Visit: Payer: Self-pay

## 2020-06-26 ENCOUNTER — Ambulatory Visit (INDEPENDENT_AMBULATORY_CARE_PROVIDER_SITE_OTHER): Payer: 59 | Admitting: Physical Therapy

## 2020-06-26 ENCOUNTER — Encounter: Payer: Self-pay | Admitting: Physical Therapy

## 2020-06-26 DIAGNOSIS — M25512 Pain in left shoulder: Secondary | ICD-10-CM

## 2020-06-26 NOTE — Patient Instructions (Signed)
Access Code: [email or web address] URL: [email or web address] Date: 06/26/2020 Prepared by: Lyndee Hensen  Exercises Supine Shoulder Flexion Extension AAROM with Dowel - 1-2 x daily - 1 sets - 10 reps - 5 hold Supine Shoulder External Rotation with Dowel - 1-2 x daily - 1 sets - 10 reps - 5 hold Supine Chest Stretch with Elbows Bent - 1 x daily - 1 sets - 10 reps - 5 hold Doorway Pec Stretch at 60 Elevation - 2 x daily - 2 sets - 3 reps Shoulder External Rotation with Anchored Resistance - 1 x daily - 2 sets - 10 reps Standing Shoulder Internal Rotation with Anchored Resistance - 1 x daily - 2 sets - 10 reps Standing Row with Anchored Resistance - 1 x daily - 2 sets - 10 reps Tricep Push Up on Wall - 1 x daily - 2 sets - 10 reps Standing Shoulder Scaption - 1 x daily - 1 sets - 10 reps Standing Shoulder Internal Rotation Stretch with Hands Behind Back - 2 x daily - 5 reps - 10 hold Standing Shoulder Internal Rotation Stretch with Towel - 2 x daily - 5 reps - 10 hold

## 2020-06-26 NOTE — Therapy (Signed)
[facility] [address] [place], [place], [address] Phone: [phone]   Fax:  [phone]  Physical Therapy Treatment/Discharge   Patient Details  Name: Jodi Bailey MRN: [id number] Date of Birth: 01-15-1966 Referring Provider (PT): Lynne Leader   Encounter Date: 06/26/2020   PT End of Session - 06/26/20 1015    Visit Number 14    Number of Visits 20    Date for PT Re-Evaluation 07/19/20    Authorization Type UHC    PT Start Time 0928    PT Stop Time 1008    PT Time Calculation (min) 40 min    Activity Tolerance Patient tolerated treatment well    Behavior During Therapy [facility] for tasks assessed/performed           Past Medical History:  Diagnosis Date   Anxiety    Atrial fibrillation ([place])    Chronic headaches    Depression    Hypertension    Palpitations     History reviewed. No pertinent surgical history.  There were no vitals filed for this visit.   Subjective Assessment - 06/26/20 0932    Subjective Pt states not doing much activity over the weekend. Shoulder feels stiff today, and mildly sore.    Currently in Pain? Yes    Pain Score 2     Pain Location Shoulder    Pain Orientation Left    Pain Descriptors / Indicators Aching    Pain Type Chronic pain    Pain Onset More than a month ago    Pain Frequency Intermittent                             [facility] Adult PT Treatment/Exercise - 06/26/20 0001      Shoulder Exercises: Standing   External Rotation Left;20 reps    Theraband Level (Shoulder External Rotation) Level 3 (Green)    Internal Rotation Left;20 reps    Theraband Level (Shoulder Internal Rotation) Level 3 (Green)    Row 20 reps    Theraband Level (Shoulder Row) Level 4 (Blue)    Other Standing Exercises IR stretch behind back x 10 ea;      Other Standing Exercises AROM 1# flexion x 15,  scaption x15,  wall angel x 10;       Shoulder Exercises: Pulleys   Flexion 2  minutes      Shoulder Exercises: ROM/Strengthening   Wall Pushups 20 reps      Shoulder Exercises: Stretch   Corner Stretch 3 reps;30 seconds    Corner Stretch Limitations Doorway stretch 45 and 90 deg      Manual Therapy   Manual Therapy Passive ROM;Joint mobilization    Joint Mobilization post and inf mobs     Passive ROM all motions                  PT Education - 06/26/20 1015    Education Details Final HEP reviewed    Methods Explanation;Demonstration;Tactile cues;Verbal cues;Handout    Comprehension Verbalized understanding;Returned demonstration;Verbal cues required            PT Short Term Goals - 06/07/20 1645      PT SHORT TERM GOAL #1   Title Pt to be independent with intial HEP    Time 2    Period Weeks    Status Achieved    Target Date 05/16/20      PT SHORT TERM  GOAL #2   Title Pt to report decreased pain in L shoulder to 4/10    Time 2    Period Weeks    Status Achieved    Target Date 05/16/20             PT Long Term Goals - 06/26/20 1016      PT LONG TERM GOAL #1   Title Pt to be independent with final HEP    Time 6    Period Weeks    Status Achieved      PT LONG TERM GOAL #2   Title Pt to report decreased pain in L shoulder to 0-2/10 with reaching and IADLS.    Time 6    Period Weeks    Status Achieved      PT LONG TERM GOAL #3   Title Pt to demo full AROM of L shoulder without pain, to improve ability for ADLs and IADLS.    Time 6    Period Weeks    Status Achieved      PT LONG TERM GOAL #4   Title Pt to demo improved strength of L shoulder girdle to at least 4+/5 for all motions, to improve ability for lifting, carrying, and IADLs.    Time 6    Period Weeks    Status Achieved                 Plan - 06/26/20 1204    Clinical Impression Statement Pt has made good progress with ROM , strength, and function. She has met all goals at this time, but does continue to have mild pain/soreness with certain motions,  end range ER and IR behind the back. Pt please with progress at this time, ready for d/c and will follow up with MD in a couple weeks, she did not receive injection previously, but may still be an option.    PT Frequency 2x / week    PT Duration 6 weeks    PT Treatment/Interventions ADLs/Self Care Home Management;Cryotherapy;Electrical Stimulation;Iontophoresis 4mg /ml Dexamethasone;Moist Heat;DME Instruction;Neuromuscular re-education;Therapeutic exercise;Therapeutic activities;Functional mobility training;Patient/family education;Manual techniques;Taping;Dry needling;Passive range of motion;Joint Manipulations;Spinal Manipulations;Vasopneumatic Device    PT Next Visit Plan assess goals in prep for d/c    PT Home Exercise Plan [email or web address]           Patient will benefit from skilled therapeutic intervention in order to improve the following deficits and impairments:  Decreased range of motion, Impaired UE functional use, Increased muscle spasms, Decreased activity tolerance, Pain, Impaired flexibility, Improper body mechanics, Decreased mobility, Decreased strength  Visit Diagnosis: Acute pain of left shoulder     Problem List Patient Active Problem List   Diagnosis Date Noted   Paroxysmal atrial fibrillation ([facility]) 06/18/2019   Morbid obesity ([facility]) 06/18/2019   Hypertension 02/15/2017   Headache 01/27/2017   Bilateral swelling of feet and ankles 01/02/2017   Routine general medical examination at a health care facility 10/15/2016   Otalgia of both ears 09/26/2016   Headache(784.0) 07/13/2014   Arrhythmia 07/13/2014   Dizziness 07/13/2014   Obesity (BMI 30-39.9) 03/16/2014   Depression 08/31/2013   LOW BACK PAIN SYNDROME 02/18/2011   SKIN CANCER, HX OF 02/18/2011   BACK PAIN 02/04/2011    04/06/2011, PT, DPT 12:08 PM  06/26/20    [facility] [address] [place], [place], [place] Phone: [phone]   Fax:   [phone]  Name: Jodi Bailey MRN: Lucile Crater Date of Birth: [birth year]  PHYSICAL THERAPY DISCHARGE SUMMARY  Visits from Start of Care:14 Plan: Patient agrees to discharge.  Patient goals were met. Patient is being discharged due to meeting the stated rehab goals.  ?????     Lyndee Hensen, PT, DPT 12:08 PM  06/26/20

## 2020-07-04 ENCOUNTER — Ambulatory Visit: Payer: 59 | Admitting: Family Medicine

## 2020-07-05 ENCOUNTER — Encounter: Payer: Self-pay | Admitting: Family Medicine

## 2020-07-05 ENCOUNTER — Ambulatory Visit (INDEPENDENT_AMBULATORY_CARE_PROVIDER_SITE_OTHER): Payer: 59 | Admitting: Family Medicine

## 2020-07-05 ENCOUNTER — Ambulatory Visit: Payer: Self-pay

## 2020-07-05 ENCOUNTER — Other Ambulatory Visit: Payer: Self-pay

## 2020-07-05 VITALS — BP 122/76 | HR 73 | Ht 67.0 in | Wt 266.6 lb

## 2020-07-05 DIAGNOSIS — M25512 Pain in left shoulder: Secondary | ICD-10-CM

## 2020-07-05 NOTE — Patient Instructions (Addendum)
You had a L shoulder injection today.  Call or go to the ER if you develop a large red swollen joint with extreme pain or oozing puss.   Continue home exercises.  I am happy to re-auth PT in the future if needed.   MRI in the future if not better is an option.

## 2020-07-05 NOTE — Progress Notes (Signed)
   I, Wendy Poet, LAT, ATC, am serving as scribe for Dr. Lynne Leader.  Jodi Bailey is a 54 y.o. female who presents to [place] at [facility] today for f/u of L shoulder pain after suffering a fall and landing on her L shoulder and hand in early May 2021.  She was last seen by Dr. Georgina Snell on 06/01/20 and noted approximate 80% improvement.  She has completed 14 PT sessions.  Since her last visit w/ Dr. Georgina Snell, pt reports that she has con't to improve and is happy w/ her progress.  She states that her biggest issue is w/ hier L shoulder IR and is not quite 100%.  She states that she feels like she can do her exercises at home at this point.  She con't to have a minimal level of pain and feels like she's ready to get an injection.  Diagnostic testing: L humerus XR- 04/03/20  Pertinent review of systems: No fevers or chills  Relevant historical information: A. fib   Exam:  BP 122/76 (BP Location: Right Arm, Patient Position: Sitting, Cuff Size: Large)   Pulse 73   Ht 5\' 7"  (1.702 m)   Wt 266 lb 9.6 oz (120.9 kg)   LMP 02/03/2017   SpO2 98%   BMI 41.76 kg/m  General: Well Developed, well nourished, and in no acute distress.   MSK: Left shoulder normal-appearing Nontender. Range of motion normal abduction and external rotation.  Internal rotation limited to lumbar spine. Intact strength.    Lab and Radiology Results  Procedure: Real-time Ultrasound Guided Injection of left shoulder subacromial bursa Device: Philips Affiniti 50G Images permanently stored and available for review in the ultrasound unit. Verbal informed consent obtained.  Discussed risks and benefits of procedure. Warned about infection bleeding damage to structures skin hypopigmentation and fat atrophy among others. Patient expresses understanding and agreement Time-out conducted.   Noted no overlying erythema, induration, or other signs of local infection.   Skin prepped in a sterile fashion.     Local anesthesia: Topical Ethyl chloride.   With sterile technique and under real time ultrasound guidance:  40 mg of Depo-Medrol and 2 mL of Marcaine injected easily.   Completed without difficulty   Pain immediately resolved suggesting accurate placement of the medication.   Advised to call if fevers/chills, erythema, induration, drainage, or persistent bleeding.   Images permanently stored and available for review in the ultrasound unit.  Impression: Technically successful ultrasound guided injection.      Assessment and Plan: 54 y.o. female with left shoulder pain significant improvement with physical therapy but still having some issues.  Plan to proceed with injection today and continued home exercise program.  Recheck back as needed.   PDMP not reviewed this encounter. Orders Placed This Encounter  Procedures  . [place] LIMITED JOINT SPACE STRUCTURES UP LEFT(NO LINKED CHARGES)    Order Specific Question:   Reason for Exam (SYMPTOM  OR DIAGNOSIS REQUIRED)    Answer:   L shoulder pain    Order Specific Question:   Preferred imaging location?    Answer:   [place]   No orders of the defined types were placed in this encounter.    Discussed warning signs or symptoms. Please see discharge instructions. Patient expresses understanding.   The above documentation has been reviewed and is accurate and complete Lynne Leader, M.D.

## 2020-07-25 ENCOUNTER — Encounter: Payer: Self-pay | Admitting: Family

## 2020-07-26 ENCOUNTER — Other Ambulatory Visit: Payer: Self-pay | Admitting: Family

## 2020-07-26 MED ORDER — FUROSEMIDE 20 MG PO TABS: ORAL_TABLET | ORAL | 1 refills | Status: DC

## 2020-08-09 ENCOUNTER — Other Ambulatory Visit: Payer: Self-pay

## 2020-08-09 ENCOUNTER — Ambulatory Visit (INDEPENDENT_AMBULATORY_CARE_PROVIDER_SITE_OTHER): Payer: BC Managed Care – PPO | Admitting: Family

## 2020-08-09 VITALS — BP 128/76 | HR 67 | Temp 98.0°F | Wt 264.8 lb

## 2020-08-09 DIAGNOSIS — R7303 Prediabetes: Secondary | ICD-10-CM

## 2020-08-09 DIAGNOSIS — R600 Localized edema: Secondary | ICD-10-CM | POA: Diagnosis not present

## 2020-08-09 MED ORDER — FUROSEMIDE 20 MG PO TABS: 20.0000 mg | ORAL_TABLET | Freq: Two times a day (BID) | ORAL | 1 refills | Status: DC | PRN

## 2020-08-09 MED ORDER — METFORMIN HCL ER 500 MG PO TB24: 500.0000 mg | ORAL_TABLET | Freq: Every day | ORAL | 0 refills | Status: DC

## 2020-08-09 NOTE — Progress Notes (Signed)
Jodi Bailey is a 54 y.o. female with the following history as recorded in EpicCare:  Patient Active Problem List   Diagnosis Date Noted   Paroxysmal atrial fibrillation ([place]) 06/18/2019   Morbid obesity ([place]) 06/18/2019   Hypertension 02/15/2017   Headache 01/27/2017   Bilateral swelling of feet and ankles 01/02/2017   Routine general medical examination at a health care facility 10/15/2016   Otalgia of both ears 09/26/2016   Headache(784.0) 07/13/2014   Arrhythmia 07/13/2014   Dizziness 07/13/2014   Obesity (BMI 30-39.9) 03/16/2014   Depression 08/31/2013   LOW BACK PAIN SYNDROME 02/18/2011   SKIN CANCER, HX OF 02/18/2011   BACK PAIN 02/04/2011    Current Outpatient Medications  Medication Sig Dispense Refill   acetaminophen (TYLENOL) 325 MG tablet Take 650 mg by mouth every 6 (six) hours as needed. As needed for pain/headaches     furosemide (LASIX) 20 MG tablet Take 1 tablet (20 mg total) by mouth 2 (two) times daily as needed. 180 tablet 1   losartan (COZAAR) 100 MG tablet Take 1 tablet (100 mg total) by mouth daily. 90 tablet 1   Multiple Vitamins-Minerals (MULTIVITAMIN PO) Take 1 tablet by mouth daily.     potassium chloride SA (KLOR-CON) 20 MEQ tablet Take 1 tablet (20 mEq total) by mouth daily. 90 tablet 3   Cholecalciferol (VITAMIN D PO) Take 1 tablet by mouth daily.     metFORMIN (GLUCOPHAGE XR) 500 MG 24 hr tablet Take 1 tablet (500 mg total) by mouth daily. 90 tablet 0   No current facility-administered medications for this visit.    Allergies: Codeine, Diovan [valsartan], Lisinopril, and Sulfa antibiotics  Past Medical History:  Diagnosis Date   Anxiety    Atrial fibrillation ([facility])    Chronic headaches    Depression    Hypertension    Palpitations     No past surgical history on file.  Family History  Problem Relation Age of Onset   Depression Sister        situational   Depression Son    Suicidality Son    Crohn's  disease Mother    Diverticulitis Mother    Hypertension Father    Diabetes Father    Heart disease Father    Hyperlipidemia Maternal Grandmother    Hypertension Maternal Grandmother    Osteoporosis Maternal Grandmother    Stroke Maternal Grandfather    Colon cancer Paternal Grandmother    Heart disease Paternal Grandfather    Heart attack Paternal Grandfather    Alcohol abuse Neg Hx     Social History   Tobacco Use   Smoking status: Never Smoker   Smokeless tobacco: Never Used  Substance Use Topics   Alcohol use: Yes    Comment: Very RARELY    Subjective:  Follow-up on hypertension/ pedal edema; still having difficulty with swelling especially in left foot; notes that she does sit for the majority of the day at work/ not elevating at work; cardiology wanted to do echo in 2020 but patient had personal issues that prevented follow-up;     Objective:  Vitals:   08/09/20 0852  BP: 128/76  Pulse: 67  Temp: 98 F (36.7 C)  TempSrc: Oral  SpO2: 97%  Weight: 264 lb 12.8 oz (120.1 kg)    General: Well developed, well nourished, in no acute distress  Skin : Warm and dry.  Head: Normocephalic and atraumatic  Lungs: Respirations unlabored; clear to auscultation bilaterally without wheeze, rales, rhonchi  CVS  exam: normal rate and regular rhythm.  Musculoskeletal: No deformities; no active joint inflammation  Extremities: bilateral pittingedema ( L > R), no cyanosis, clubbing  Vessels: Symmetric bilaterally  Neurologic: Alert and oriented; speech intact; face symmetrical; moves all extremities well; CNII-XII intact without focal deficit    Assessment:  1. Pedal edema   2. Pre-diabetes     Plan:  Refer to cardiology; continue same medications for now; could also consider CT of abd/ pelvis if swelling persists and no cardiac issues noted; Trial of Metformin XR 500 mg qd;    Return in about 3 months (around 11/08/2020).  Orders Placed This Encounter   Procedures   Ambulatory referral to Cardiology    Referral Priority:   Routine    Referral Type:   Consultation    Referral Reason:   Specialty Services Required    Requested Specialty:   Cardiology    Number of Visits Requested:   1    Requested Prescriptions   Signed Prescriptions Disp Refills   metFORMIN (GLUCOPHAGE XR) 500 MG 24 hr tablet 90 tablet 0    Sig: Take 1 tablet (500 mg total) by mouth daily.   furosemide (LASIX) 20 MG tablet 180 tablet 1    Sig: Take 1 tablet (20 mg total) by mouth 2 (two) times daily as needed.

## 2020-08-18 ENCOUNTER — Other Ambulatory Visit: Payer: Self-pay | Admitting: Family

## 2020-09-07 ENCOUNTER — Other Ambulatory Visit: Payer: Self-pay

## 2020-09-07 ENCOUNTER — Ambulatory Visit (INDEPENDENT_AMBULATORY_CARE_PROVIDER_SITE_OTHER): Payer: BC Managed Care – PPO | Admitting: Cardiology

## 2020-09-07 ENCOUNTER — Encounter: Payer: Self-pay | Admitting: Cardiology

## 2020-09-07 VITALS — BP 124/76 | HR 73 | Ht 67.0 in | Wt 267.8 lb

## 2020-09-07 DIAGNOSIS — I480 Paroxysmal atrial fibrillation: Secondary | ICD-10-CM | POA: Diagnosis not present

## 2020-09-07 DIAGNOSIS — I10 Essential (primary) hypertension: Secondary | ICD-10-CM | POA: Diagnosis not present

## 2020-09-07 DIAGNOSIS — Z7189 Other specified counseling: Secondary | ICD-10-CM

## 2020-09-07 DIAGNOSIS — Z6841 Body Mass Index (BMI) 40.0 and over, adult: Secondary | ICD-10-CM

## 2020-09-07 DIAGNOSIS — R600 Localized edema: Secondary | ICD-10-CM | POA: Diagnosis not present

## 2020-09-07 NOTE — Patient Instructions (Signed)
Medication Instructions:  Your Physician recommend you continue on your current medication as directed.    *If you need a refill on your cardiac medications before your next appointment, please call your pharmacy*   Lab Work: None ordered   Testing/Procedures: Your physician has requested that you have an echocardiogram. Echocardiography is a painless test that uses sound waves to create images of your heart. It provides your doctor with information about the size and shape of your heart and how well your heart's chambers and valves are working. This procedure takes approximately one hour. There are no restrictions for this procedure. [address]   Follow-Up: At [facility], you and your health needs are our priority.  As part of our continuing mission to provide you with exceptional heart care, we have created designated Provider Care Teams.  These Care Teams include your primary Cardiologist (physician) and Advanced Practice Providers (APPs -  Physician Assistants and Nurse Practitioners) who all work together to provide you with the care you need, when you need it.  We recommend signing up for the patient portal called "MyChart".  Sign up information is provided on this After Visit Summary.  MyChart is used to connect with patients for Virtual Visits (Telemedicine).  Patients are able to view lab/test results, encounter notes, upcoming appointments, etc.  Non-urgent messages can be sent to your provider as well.   To learn more about what you can do with MyChart, go to [email or web address].    Your next appointment:   As needed  The format for your next appointment:   In Person  Provider:   Bridgette Christopher, MD     

## 2020-09-07 NOTE — Progress Notes (Signed)
Cardiology Office Note:    Date:  09/07/2020   ID:  Jodi Bailey, DOB January 24, 1966, MRN [id number]  PCP:  Marrian Salvage, [place]  Cardiologist:  Buford Dresser, MD  Referring MD: Marrian Salvage,*   CC: new patient consultation for lower extremity edema  History of Present Illness:    Jodi Bailey is a 54 y.o. female with a hx of paroxysmal atrial fibrillation, hypertension who is seen as a new consult at the request of Marrian Salvage,* for the evaluation and management of lower extremity edema.  Note from Marrian Salvage, [place] from 08/09/20 reviewed. Noted continued swelling in feet, left more than right. Notes to sit for most of the day. Referred for further evaluation.  Wakes up, legs are fine. By the end of the day, legs significantly swollen. Takes lasix 20 mg twice a day. Has been going on since this spring most recently. Had a similar issue in the past, but this resolved on its own.   Working on weight loss, improved with weight loss in the past. Currently under a lot of stress, went through a divorce, caring for her two elderly parents.   Family history: father is 61, has afib, CABG, bovine valve replacement, hypertension, diabetes. Mother is 52, no heart issues. Brother is 14, has HTN. Brother is 61, has HTN and prostate cancer. Sister is 81, no heart issues.  Was told in her 56s that she had afib. Was on atenolol for several years. No recent occurrences that she is aware of.  Can play paintball with her sons, active around the paintball field.   Denies chest pain, shortness of breath at rest or with normal exertion. No PND, orthopnea, or unexpected weight gain. No syncope, occasional palpitations with stress.  We discussed possible etiologies of LE edema at length today.  Past Medical History:  Diagnosis Date  . Anxiety   . Atrial fibrillation ([place])   . Chronic headaches   . Depression   . Hypertension   . Palpitations      History reviewed. No pertinent surgical history.  Current Medications: Current Outpatient Medications on File Prior to Visit  Medication Sig  . Cholecalciferol (VITAMIN D PO) Take 1 tablet by mouth daily.  . furosemide (LASIX) 20 MG tablet Take 1 tablet (20 mg total) by mouth 2 (two) times daily as needed.  Marland Kitchen losartan (COZAAR) 100 MG tablet Take 1 tablet (100 mg total) by mouth daily.  . metFORMIN (GLUCOPHAGE XR) 500 MG 24 hr tablet Take 1 tablet (500 mg total) by mouth daily.  Marland Kitchen MILK THISTLE PO Take by mouth daily. Patient takes 2 tablets daily.  . Multiple Vitamins-Minerals (MULTIVITAMIN PO) Take 1 tablet by mouth daily.  . Nutritional Supplements (JUICE PLUS FIBRE PO) Take by mouth daily.  Marland Kitchen OVER THE COUNTER MEDICATION daily. Red Beet Capsules  . potassium chloride SA (KLOR-CON) 20 MEQ tablet Take 1 tablet (20 mEq total) by mouth daily.   No current facility-administered medications on file prior to visit.     Allergies:   Codeine, Diovan [valsartan], Lisinopril, and Sulfa antibiotics   Social History   Tobacco Use  . Smoking status: Never Smoker  . Smokeless tobacco: Never Used  Substance Use Topics  . Alcohol use: Yes    Comment: Very RARELY  . Drug use: No    Family History: family history includes Colon cancer in her paternal grandmother; Crohn's disease in her mother; Depression in her sister and son; Diabetes in her father;  Diverticulitis in her mother; Heart attack in her paternal grandfather; Heart disease in her father and paternal grandfather; Hyperlipidemia in her maternal grandmother; Hypertension in her father and maternal grandmother; Osteoporosis in her maternal grandmother; Stroke in her maternal grandfather; Suicidality in her son. There is no history of Alcohol abuse.  ROS:   Please see the history of present illness.  Additional pertinent ROS: Constitutional: Negative for chills, fever, night sweats, unintentional weight loss  HENT: Negative for ear  pain and hearing loss.   Eyes: Negative for loss of vision and eye pain.  Respiratory: Negative for cough, sputum, wheezing.   Cardiovascular: See HPI. Gastrointestinal: Negative for abdominal pain, melena, and hematochezia.  Genitourinary: Negative for dysuria and hematuria.  Musculoskeletal: Negative for falls and myalgias.  Skin: Negative for itching and rash.  Neurological: Negative for focal weakness, focal sensory changes and loss of consciousness.  Endo/Heme/Allergies: Does not bruise/bleed easily.     EKGs/Labs/Other Studies Reviewed:    The following studies were reviewed today: Nuclear stress test 02/24/2017 (scan--[facility]) Low risk, no ischemia  Echo 03/06/2017 (scan--[facility]) Normal LVEF Trace MR, mild TR  EKG:  EKG is personally reviewed.  The ekg ordered today demonstrates NSR at 73 bpm  Recent Labs: 04/03/2020: ALT 34; BUN 23; Creatinine, Ser 1.01; Hemoglobin 14.7; Platelets 261.0; Potassium 4.2; Pro B Natriuretic peptide (BNP) 64.0; Sodium 139  Recent Lipid Panel    Component Value Date/Time   CHOL 218 (H) 04/03/2020 1058   TRIG 162.0 (H) 04/03/2020 1058   HDL 47.00 04/03/2020 1058   CHOLHDL 5 04/03/2020 1058   VLDL 32.4 04/03/2020 1058   LDLCALC 139 (H) 04/03/2020 1058    Physical Exam:    VS:  BP 124/76   Pulse 73   Ht 5\' 7"  (1.702 m)   Wt 267 lb 12.8 oz (121.5 kg)   LMP 02/03/2017   SpO2 97%   BMI 41.94 kg/m     Wt Readings from Last 3 Encounters:  09/07/20 267 lb 12.8 oz (121.5 kg)  08/09/20 264 lb 12.8 oz (120.1 kg)  07/05/20 266 lb 9.6 oz (120.9 kg)    GEN: Well nourished, well developed in no acute distress HEENT: Normal, moist mucous membranes NECK: No JVD CARDIAC: regular rhythm, normal S1 and S2, no rubs or gallops. No murmurs. VASCULAR: Radial and DP pulses 2+ bilaterally. No carotid bruits RESPIRATORY:  Clear to auscultation without rales, wheezing or rhonchi  ABDOMEN: Soft, non-tender, non-distended MUSCULOSKELETAL:  Ambulates  independently SKIN: Warm and dry. There is no pitting edema. There is bilateral trace to 1+ nonpitting edema, dependent in calves, with mild skin discoloration. NEUROLOGIC:  Alert and oriented x 3. No focal neuro deficits noted. PSYCHIATRIC:  Normal affect    ASSESSMENT:    1. Lower extremity edema   2. Paroxysmal atrial fibrillation (HCC)   3. Essential hypertension   4. Class 3 severe obesity due to excess calories without serious comorbidity with body mass index (BMI) of 40.0 to 44.9 in adult ([place])   5. Counseling on health promotion and disease prevention    PLAN:    Lower extremity edema: nonpitting. Strong pulses.  -we discussed possible etiologies of LE edema at length today -lungs clear, no JVD on exam -will check echo to exclude cardiac etiology -with nonpitting edema, mild skin discoloration, high suspicion for chronic venous insufficiency -we discussed compression, elevation, exercise, salt avoidance today -we discussed use of lasix today. Lasix helpful only if elevated venous pressures systemically, cannot help if fluid is  already third spaced. Recommend infrequent use of lasix, reserve for diffuse/persistent swelling. If her BP elevates, can consider spironolactone or chlorthalidone for BP and modest fluid effect  Hypertension: at goal today -recommended she scale back on lasix, monitor BP to see if it rises.  Remote history of paroxysmal atrial fibrillation: about 20 years ago. None noted in years  Obesity, BMI 41: working on weight loss. On metformin for weight loss. She will consider discussing GLP1RA with her PCP to see if she is a candidate.  Cardiac risk counseling and prevention recommendations: she is working on weight loss -recommend heart healthy/Mediterranean diet, with whole grains, fruits, vegetable, fish, lean meats, nuts, and olive oil. Limit salt. -recommend moderate walking, 3-5 times/week for 30-50 minutes each session. Aim for at least 150 minutes.week.  Goal should be pace of 3 miles/hours, or walking 1.5 miles in 30 minutes -recommend avoidance of tobacco products. Avoid excess alcohol. -ASCVD risk score: The 10-year ASCVD risk score Mikey Bussing DC Brooke Bonito., et al., 2013) is: 2.7%   Values used to calculate the score:     Age: 59 years     Sex: Female     Is Non-Hispanic African American: No     Diabetic: No     Tobacco smoker: No     Systolic Blood Pressure: 620 mmHg     Is BP treated: Yes     HDL Cholesterol: 47 mg/dL     Total Cholesterol: 218 mg/dL    Plan for follow up: if echo unremarkable, can follow up as needed  Buford Dresser, MD, PhD [place]  [facility]    Medication Adjustments/Labs and Tests Ordered: Current medicines are reviewed at length with the patient today.  Concerns regarding medicines are outlined above.  Orders Placed This Encounter  Procedures  . EKG 12-Lead  . ECHOCARDIOGRAM COMPLETE   No orders of the defined types were placed in this encounter.   Patient Instructions  Medication Instructions:  Your Physician recommend you continue on your current medication as directed.    *If you need a refill on your cardiac medications before your next appointment, please call your pharmacy*   Lab Work: None ordered   Testing/Procedures: Your physician has requested that you have an echocardiogram. Echocardiography is a painless test that uses sound waves to create images of your heart. It provides your doctor with information about the size and shape of your heart and how well your heart's chambers and valves are working. This procedure takes approximately one hour. There are no restrictions for this procedure. [place] [address]    Follow-Up: At [facility], you and your health needs are our priority.  As part of our continuing mission to provide you with exceptional heart care, we have created designated Provider Care Teams.  These Care Teams include your primary Cardiologist  (physician) and Advanced Practice Providers (APPs -  Physician Assistants and Nurse Practitioners) who all work together to provide you with the care you need, when you need it.  We recommend signing up for the patient portal called "MyChart".  Sign up information is provided on this After Visit Summary.  MyChart is used to connect with patients for Virtual Visits (Telemedicine).  Patients are able to view lab/test results, encounter notes, upcoming appointments, etc.  Non-urgent messages can be sent to your provider as well.   To learn more about what you can do with MyChart, go to [email or web address].    Your next appointment:   As needed  The format for your next appointment:   In Person  Provider:   Buford Dresser, MD       Signed, Buford Dresser, MD PhD 09/07/2020 2:31 PM    [place]

## 2020-09-18 ENCOUNTER — Encounter: Payer: Self-pay | Admitting: Family

## 2020-09-25 ENCOUNTER — Telehealth (HOSPITAL_COMMUNITY): Payer: Self-pay | Admitting: Cardiology

## 2020-09-25 NOTE — Telephone Encounter (Signed)
Patient cancelled echocardiogram and does not wish to reschedule at this time. Order will be removed from the echo WQ. If patient calls at later date to reschedule we will reinstate the order.

## 2020-09-28 ENCOUNTER — Encounter: Payer: Self-pay | Admitting: Family

## 2020-09-28 ENCOUNTER — Other Ambulatory Visit (HOSPITAL_COMMUNITY): Payer: BC Managed Care – PPO

## 2020-10-02 MED ORDER — AMBULATORY NON FORMULARY MEDICATION: 0 refills | Status: DC

## 2020-11-05 ENCOUNTER — Other Ambulatory Visit: Payer: Self-pay | Admitting: Family

## 2020-11-08 ENCOUNTER — Encounter: Payer: Self-pay | Admitting: Family

## 2020-11-08 ENCOUNTER — Other Ambulatory Visit: Payer: Self-pay | Admitting: Family

## 2020-11-08 ENCOUNTER — Other Ambulatory Visit: Payer: Self-pay

## 2020-11-08 ENCOUNTER — Ambulatory Visit (INDEPENDENT_AMBULATORY_CARE_PROVIDER_SITE_OTHER): Payer: BC Managed Care – PPO | Admitting: Family

## 2020-11-08 VITALS — BP 112/68 | HR 69 | Temp 98.4°F | Ht 67.0 in | Wt 269.0 lb

## 2020-11-08 DIAGNOSIS — I10 Essential (primary) hypertension: Secondary | ICD-10-CM

## 2020-11-08 DIAGNOSIS — I872 Venous insufficiency (chronic) (peripheral): Secondary | ICD-10-CM

## 2020-11-08 DIAGNOSIS — R7303 Prediabetes: Secondary | ICD-10-CM | POA: Diagnosis not present

## 2020-11-08 LAB — COMPREHENSIVE METABOLIC PANEL
ALT: 22 U/L (ref 0–35)
AST: 24 U/L (ref 0–37)
Albumin: 4.2 g/dL (ref 3.5–5.2)
Alkaline Phosphatase: 104 U/L (ref 39–117)
BUN: 17 mg/dL (ref 6–23)
CO2: 25 mEq/L (ref 19–32)
Calcium: 9.3 mg/dL (ref 8.4–10.5)
Chloride: 104 mEq/L (ref 96–112)
Creatinine, Ser: 0.94 mg/dL (ref 0.40–1.20)
GFR: 69.18 mL/min (ref >60.00)
Glucose, Bld: 97 mg/dL (ref 70–99)
Potassium: 4.3 mEq/L (ref 3.5–5.1)
Sodium: 136 mEq/L (ref 135–145)
Total Bilirubin: 1.2 mg/dL (ref 0.2–1.2)
Total Protein: 7.3 g/dL (ref 6.0–8.3)

## 2020-11-08 LAB — CBC WITH DIFFERENTIAL/PLATELET
Basophils Absolute: 0.1 10*3/uL (ref 0.0–0.1)
Basophils Relative: 0.7 % (ref 0.0–3.0)
Eosinophils Absolute: 0.3 10*3/uL (ref 0.0–0.7)
Eosinophils Relative: 3.6 % (ref 0.0–5.0)
HCT: 43.5 % (ref 36.0–46.0)
Hemoglobin: 14.4 g/dL (ref 12.0–15.0)
Lymphocytes Relative: 32.7 % (ref 12.0–46.0)
Lymphs Abs: 2.6 10*3/uL (ref 0.7–4.0)
MCHC: 33.2 g/dL (ref 30.0–36.0)
MCV: 89.5 fl (ref 78.0–100.0)
Monocytes Absolute: 0.5 10*3/uL (ref 0.1–1.0)
Monocytes Relative: 6.5 % (ref 3.0–12.0)
Neutro Abs: 4.5 10*3/uL (ref 1.4–7.7)
Neutrophils Relative %: 56.5 % (ref 43.0–77.0)
Platelets: 258 10*3/uL (ref 150.0–400.0)
RBC: 4.86 Mil/uL (ref 3.87–5.11)
RDW: 13.5 % (ref 11.5–15.5)
WBC: 8 10*3/uL (ref 4.0–10.5)

## 2020-11-08 LAB — HEMOGLOBIN A1C: Hgb A1c MFr Bld: 5.7 % (ref 4.6–6.5)

## 2020-11-08 MED ORDER — MOMETASONE FUROATE 0.1 % EX CREA: 1.0000 "application " | TOPICAL_CREAM | Freq: Two times a day (BID) | CUTANEOUS | 0 refills | Status: DC | PRN

## 2020-11-08 MED ORDER — SAXENDA 18 MG/3ML ~~LOC~~ SOPN: PEN_INJECTOR | SUBCUTANEOUS | 2 refills | Status: DC

## 2020-11-08 NOTE — Progress Notes (Signed)
Jodi Bailey is a 54 y.o. female with the following history as recorded in EpicCare:  Patient Active Problem List   Diagnosis Date Noted  . Paroxysmal atrial fibrillation ([place]) 06/18/2019  . Morbid obesity ([place]) 06/18/2019  . Hypertension 02/15/2017  . Headache 01/27/2017  . Bilateral swelling of feet and ankles 01/02/2017  . Routine general medical examination at a health care facility 10/15/2016  . Otalgia of both ears 09/26/2016  . Headache(784.0) 07/13/2014  . Arrhythmia 07/13/2014  . Dizziness 07/13/2014  . Obesity (BMI 30-39.9) 03/16/2014  . Depression 08/31/2013  . LOW BACK PAIN SYNDROME 02/18/2011  . SKIN CANCER, HX OF 02/18/2011  . BACK PAIN 02/04/2011    Current Outpatient Medications  Medication Sig Dispense Refill  . AMBULATORY NON FORMULARY MEDICATION I prescribe the purchase of a hot tub as medically necessary for pain in the arm, shoulder, neck and upper back due to a rotator cuff tear. 1 each 0  . Cholecalciferol (VITAMIN D PO) Take 1 tablet by mouth daily.    . furosemide (LASIX) 20 MG tablet Take 1 tablet (20 mg total) by mouth 2 (two) times daily as needed. 180 tablet 1  . losartan (COZAAR) 100 MG tablet Take 1 tablet (100 mg total) by mouth daily. 90 tablet 1  . MILK THISTLE PO Take by mouth daily. Patient takes 2 tablets daily.    . Multiple Vitamins-Minerals (MULTIVITAMIN PO) Take 1 tablet by mouth daily.    . Nutritional Supplements (JUICE PLUS FIBRE PO) Take by mouth daily.    Marland Kitchen OVER THE COUNTER MEDICATION daily. Red Beet Capsules     No current facility-administered medications for this visit.    Allergies: Codeine, Diovan [valsartan], Lisinopril, Metformin and related, and Sulfa antibiotics  Past Medical History:  Diagnosis Date  . Anxiety   . Atrial fibrillation ([place])   . Chronic headaches   . Depression   . Hypertension   . Palpitations     History reviewed. No pertinent surgical history.  Family History  Problem Relation Age of Onset   . Depression Sister        situational  . Depression Son   . Suicidality Son   . Crohn's disease Mother   . Diverticulitis Mother   . Hypertension Father   . Diabetes Father   . Heart disease Father   . Hyperlipidemia Maternal Grandmother   . Hypertension Maternal Grandmother   . Osteoporosis Maternal Grandmother   . Stroke Maternal Grandfather   . Colon cancer Paternal Grandmother   . Heart disease Paternal Grandfather   . Heart attack Paternal Grandfather   . Alcohol abuse Neg Hx     Social History   Tobacco Use  . Smoking status: Never Smoker  . Smokeless tobacco: Never Used  Substance Use Topics  . Alcohol use: Yes    Comment: Very RARELY    Subjective:  Patient presents for a follow up on her hypertension/ pre-diabetes; admits she did not understand to stop her potassium as instructed back in October; had been having a lot of increased swelling recently- stopped her potassium in the past few days and felt much better;  History of pre-diabetes- unable to tolerate Metformin due to GI; notes that difficult to exercise regularly due to needing to help care for aging parents; trying to work on healthy eating habits;     Objective:  Vitals:   11/08/20 0848  BP: 112/68  Pulse: 69  Temp: 98.4 F (36.9 C)  TempSrc: Oral  SpO2:  97%  Weight: 269 lb (122 kg)  Height: _0  (1.702 m)    General: Well developed, well nourished, in no acute distress  Skin : Warm and dry.  Head: Normocephalic and atraumatic  Eyes: Sclera and conjunctiva clear; pupils round and reactive to light; extraocular movements intact  Neck: Supple without thyromegaly, adenopathy  Lungs: Respirations unlabored; clear to auscultation bilaterally without wheeze, rales, rhonchi  CVS exam: normal rate and regular rhythm.  Neurologic: Alert and oriented; speech intact; face symmetrical; moves all extremities well; CNII-XII intact without focal deficit   Assessment:  1. Primary hypertension   2.  Pre-diabetes   3. Morbid obesity ([place])   4. Venous stasis dermatitis, unspecified laterality     Plan:  1. Doing well on Losartan; she is feeling much better since stopping the potassium supplement;  2. D/C Metformin; update labs today; 3. Discussed trial of Saxenda or Wegovy as weight loss options to also help with pre-diabetes; will update prescription once labs are back; 4. Rx for Elocon cream to apply bid to affected area as needed;  This visit occurred during the SARS-CoV-2 public health emergency.  Safety protocols were in place, including screening questions prior to the visit, additional usage of staff PPE, and extensive cleaning of exam room while observing appropriate contact time as indicated for disinfecting solutions.     No follow-ups on file.  Orders Placed This Encounter  Procedures  . CBC with Differential/Platelet    Standing Status:   Future    Number of Occurrences:   1    Standing Expiration Date:   11/08/2021  . Comp Met (CMET)    Standing Status:   Future    Number of Occurrences:   1    Standing Expiration Date:   11/08/2021  . Hemoglobin A1c    Standing Status:   Future    Number of Occurrences:   1    Standing Expiration Date:   11/08/2021    Requested Prescriptions    No prescriptions requested or ordered in this encounter

## 2020-11-13 ENCOUNTER — Telehealth: Payer: Self-pay

## 2020-11-13 ENCOUNTER — Encounter: Payer: Self-pay | Admitting: Family

## 2020-11-13 NOTE — Telephone Encounter (Signed)
Prior authorization failed online for the [place] so called patients insurance line on the back of her insurance call. customer agent states it did not go through because the PA is not need for the medication.

## 2020-11-19 ENCOUNTER — Other Ambulatory Visit: Payer: Self-pay | Admitting: Family

## 2020-11-22 ENCOUNTER — Encounter: Payer: Self-pay | Admitting: Family

## 2020-11-22 NOTE — Telephone Encounter (Signed)
   [employer] calling to request [employer] be called to complete prior auth  Use ref # [id number] Phone [id number]

## 2020-11-22 NOTE — Telephone Encounter (Signed)
Spoke with cover my meds insurance is showing cancelled on their end. I will call [employer] to further access the prior Authorization issues.

## 2020-11-22 NOTE — Telephone Encounter (Signed)
Called patient to notify her of the department closure within [facility] and that we will call them on Monday however could not leave patient a voicemail because it is full

## 2020-11-22 NOTE — Telephone Encounter (Signed)
Called [employer] prior [employer] [phone] option 3 and the department is closed will return the call Monday the 27th.

## 2020-12-14 ENCOUNTER — Other Ambulatory Visit: Payer: Self-pay

## 2020-12-14 ENCOUNTER — Telehealth (INDEPENDENT_AMBULATORY_CARE_PROVIDER_SITE_OTHER): Payer: BC Managed Care – PPO | Admitting: Family Medicine

## 2020-12-14 DIAGNOSIS — Z20818 Contact with and (suspected) exposure to other bacterial communicable diseases: Secondary | ICD-10-CM

## 2020-12-14 DIAGNOSIS — Z20822 Contact with and (suspected) exposure to covid-19: Secondary | ICD-10-CM | POA: Diagnosis not present

## 2020-12-14 DIAGNOSIS — J029 Acute pharyngitis, unspecified: Secondary | ICD-10-CM | POA: Diagnosis not present

## 2020-12-14 DIAGNOSIS — R059 Cough, unspecified: Secondary | ICD-10-CM

## 2020-12-14 MED ORDER — BENZONATATE 100 MG PO CAPS: 100.0000 mg | ORAL_CAPSULE | Freq: Three times a day (TID) | ORAL | 0 refills | Status: DC | PRN

## 2020-12-14 MED ORDER — AMOXICILLIN 500 MG PO CAPS: 500.0000 mg | ORAL_CAPSULE | Freq: Two times a day (BID) | ORAL | 0 refills | Status: DC

## 2020-12-14 NOTE — Progress Notes (Signed)
Virtual Visit via Video Note  I connected with Jodi Bailey  on 12/14/20 at  4:20 PM EST by a video enabled telemedicine application and verified that I am speaking with the correct person using two identifiers.  Location patient: home, [place] Location provider:work or home office Persons participating in the virtual visit: patient, provider  I discussed the limitations of evaluation and management by telemedicine and the availability of in person appointments. The patient expressed understanding and agreed to proceed.   HPI:  Acute telemedicine visit for a cough: -Onset: yesterday -Symptoms include:sore throat - she feels is strep, swollen glands, cough, tickle in throat, 101 fever today, nasal congestion, headache -she went for a covid test today -son diagnosed with strep AND covid at PCP office a few days ago -Denies: CP, SOB, NVD, inability to get out of bed/eat and drink -Has tried: multivitamin, tylenol and advil -Pertinent past medical history:  HTN, A. Fib, obesity -Pertinent medication allergies:lisinopril, metformin, codeine, sulfa, metformin, diovan -COVID-19 vaccine status: fully vaccinated for covid  ROS: See pertinent positives and negatives per HPI.  Past Medical History:  Diagnosis Date  . Anxiety   . Atrial fibrillation ([place])   . Chronic headaches   . Depression   . Hypertension   . Palpitations     No past surgical history on file.   Current Outpatient Medications:  .  amoxicillin (AMOXIL) 500 MG capsule, Take 1 capsule (500 mg total) by mouth 2 (two) times daily., Disp: 20 capsule, Rfl: 0 .  benzonatate (TESSALON PERLES) 100 MG capsule, Take 1 capsule (100 mg total) by mouth 3 (three) times daily as needed., Disp: 20 capsule, Rfl: 0 .  AMBULATORY NON FORMULARY MEDICATION, I prescribe the purchase of a hot tub as medically necessary for pain in the arm, shoulder, neck and upper back due to a rotator cuff tear., Disp: 1 each, Rfl: 0 .  Cholecalciferol (VITAMIN D  PO), Take 1 tablet by mouth daily., Disp: , Rfl:  .  furosemide (LASIX) 20 MG tablet, Take 1 tablet (20 mg total) by mouth 2 (two) times daily as needed., Disp: 180 tablet, Rfl: 1 .  Liraglutide -Weight Management (SAXENDA) 18 MG/3ML SOPN, 0.6 mg [place] qd x 1 week; then increase by 0.6 mg/ day to reach target 3 mg [place] daily, Disp: 3 mL, Rfl: 2 .  losartan (COZAAR) 100 MG tablet, TAKE 1 TABLET BY MOUTH EVERY DAY, Disp: 90 tablet, Rfl: 1 .  MILK THISTLE PO, Take by mouth daily. Patient takes 2 tablets daily., Disp: , Rfl:  .  mometasone (ELOCON) 0.1 % cream, Apply 1 application topically 2 (two) times daily as needed., Disp: 45 g, Rfl: 0 .  Multiple Vitamins-Minerals (MULTIVITAMIN PO), Take 1 tablet by mouth daily., Disp: , Rfl:  .  Nutritional Supplements (JUICE PLUS FIBRE PO), Take by mouth daily., Disp: , Rfl:  .  OVER THE COUNTER MEDICATION, daily. Red Beet Capsules, Disp: , Rfl:   EXAM:  VITALS per patient if applicable:  GENERAL: alert, oriented, appears well and in no acute distress  HEENT: atraumatic, conjunttiva clear, no obvious abnormalities on inspection of external nose and ears, moist mucous membranes, unable to visualize the posterior oropharynx well due to video visit and lighting, she reports tonsils do have some redness and pus, sh  NECK: normal movements of the head and neck, she reports tender lymphadenopathy in the neck on self-exam  LUNGS: on inspection no signs of respiratory distress, breathing rate appears normal, no obvious gross SOB, gasping  or wheezing  CV: no obvious cyanosis  MS: moves all visible extremities without noticeable abnormality  PSYCH/NEURO: pleasant and cooperative, no obvious depression or anxiety, speech and thought processing grossly intact  ASSESSMENT AND PLAN:  Discussed the following assessment and plan:  Strep throat exposure  Close exposure to COVID-19 virus  Cough  Sore throat  -we discussed possible serious and likely etiologies,  options for evaluation and workup, limitations of telemedicine visit vs in person visit, treatment, treatment risks and precautions. Pt prefers to treat via telemedicine empirically rather than in person at this moment.  Her son was diagnosed with both strep and COVID in the last several days and she feels that she has the same.  This is possible.  Discussed options for further evaluation, however given the current crisis situation with the COVID pandemic, she prefers to treat over a video visit rather than further evaluation in person.  She has opted to take amoxicillin 500 mg twice daily for 10 days to cover for strep throat and also do COVID testing.  She already has an appointment for testing scheduled later today.  Tessalon prescription sent for cough.  Discussed treatment options, potential complications, isolation and precautions. Work/School slipped offered: provided in patient instructions  Scheduled follow up with PCP offered: Agrees to follow-up if needed. Advised to seek prompt in person care if worsening, new symptoms arise, or if is not improving with treatment. Discussed options for inperson care if PCP office not available. Did let this patient know that I only do telemedicine on Tuesdays and Thursdays for [place]. Advised to schedule follow up visit with PCP or [facility] if any further questions or concerns to avoid delays in care.   I discussed the assessment and treatment plan with the patient. The patient was provided an opportunity to ask questions and all were answered. The patient agreed with the plan and demonstrated an understanding of the instructions.     Lucretia Kern, DO

## 2020-12-14 NOTE — Patient Instructions (Addendum)
   ---------------------------------------------------------------------------------------------------------------------------      WORK SLIP:  Patient Jodi Bailey,  [birth year], was seen for a medical visit today, 12/14/20 . Please excuse from work for a COVID like illness. We advise 10 days minimum from the onset of symptoms (12/13/20) PLUS 1 day of no fever and improved symptoms. Will defer to employer for a sooner return to work if symptoms have resolved, it is greater than 5 days since the positive test and the patient can wear a high-quality, tight fitting mask such as N95 or KN95 at all times for an additional 5 days. Would also suggest COVID19 antigen testing is negative prior to return.  Sincerely: E-signature: Dr. Colin Benton, DO [place] Ph: [phone]   ------------------------------------------------------------------------------------------------------------------------------    HOME CARE TIPS:  -[place] testing information: [email or web address]/ OR 910-068-7629 Most pharmacies also offer testing and home test kits.  -I sent the medication(s) we discussed to your pharmacy: Meds ordered this encounter  Medications  . amoxicillin (AMOXIL) 500 MG capsule    Sig: Take 1 capsule (500 mg total) by mouth 2 (two) times daily.    Dispense:  20 capsule    Refill:  0  . benzonatate (TESSALON PERLES) 100 MG capsule    Sig: Take 1 capsule (100 mg total) by mouth 3 (three) times daily as needed.    Dispense:  20 capsule    Refill:  0     -can use tylenol or aleve if needed for fevers, aches and pains per instructions  -can use nasal saline a few times per day if nasal congestion, sometime a short course of Afrin nasal spray for 3 days can help as well  -stay hydrated, drink plenty of fluids and eat small healthy meals - avoid dairy  -can take 1000 IU Vit D3 and 500mg   Vit C per  instructions  -If the Covid test is positive, check out the CDC website for more information on home care, transmission and treatment for COVID19  -follow up with your doctor in 2-3 days unless improving and feeling better  -stay home while sick, except to seek medical care, and if you have COVID19 please stay home for a full 10 days since the onset of symptoms PLUS one day of no fever and feeling better.  It was nice to meet you today, and I really hope you are feeling better soon. I help [place] out with telemedicine visits on Tuesdays and Thursdays and am available for visits on those days. If you have any concerns or questions following this visit please schedule a follow up visit with your Primary Care doctor or seek care at a local urgent care clinic to avoid delays in care.    Seek in person care promptly if your symptoms worsen, new concerns arise or you are not improving with treatment. Call 911 and/or seek emergency care if you symptoms are severe or life threatening.

## 2021-01-02 ENCOUNTER — Telehealth (INDEPENDENT_AMBULATORY_CARE_PROVIDER_SITE_OTHER): Payer: BC Managed Care – PPO | Admitting: Family

## 2021-01-02 DIAGNOSIS — J209 Acute bronchitis, unspecified: Secondary | ICD-10-CM

## 2021-01-02 DIAGNOSIS — U071 COVID-19: Secondary | ICD-10-CM

## 2021-01-02 MED ORDER — ALBUTEROL SULFATE (2.5 MG/3ML) 0.083% IN NEBU: 2.5000 mg | INHALATION_SOLUTION | Freq: Four times a day (QID) | RESPIRATORY_TRACT | 1 refills | Status: DC | PRN

## 2021-01-02 MED ORDER — BENZONATATE 100 MG PO CAPS: 100.0000 mg | ORAL_CAPSULE | Freq: Three times a day (TID) | ORAL | 0 refills | Status: DC | PRN

## 2021-01-02 MED ORDER — PREDNISONE 20 MG PO TABS: 40.0000 mg | ORAL_TABLET | Freq: Every day | ORAL | 0 refills | Status: DC

## 2021-01-02 NOTE — Progress Notes (Signed)
Jodi Bailey is a 55 y.o. female with the following history as recorded in EpicCare:  Patient Active Problem List   Diagnosis Date Noted  . Paroxysmal atrial fibrillation ([place]) 06/18/2019  . Morbid obesity ([place]) 06/18/2019  . Hypertension 02/15/2017  . Headache 01/27/2017  . Bilateral swelling of feet and ankles 01/02/2017  . Routine general medical examination at a health care facility 10/15/2016  . Otalgia of both ears 09/26/2016  . Headache(784.0) 07/13/2014  . Arrhythmia 07/13/2014  . Dizziness 07/13/2014  . Obesity (BMI 30-39.9) 03/16/2014  . Depression 08/31/2013  . LOW BACK PAIN SYNDROME 02/18/2011  . SKIN CANCER, HX OF 02/18/2011  . BACK PAIN 02/04/2011    Current Outpatient Medications  Medication Sig Dispense Refill  . albuterol (PROVENTIL) (2.5 MG/3ML) 0.083% nebulizer solution Take 3 mLs (2.5 mg total) by nebulization every 6 (six) hours as needed for wheezing or shortness of breath. 150 mL 1  . benzonatate (TESSALON) 100 MG capsule Take 1 capsule (100 mg total) by mouth 3 (three) times daily as needed. 20 capsule 0  . predniSONE (DELTASONE) 20 MG tablet Take 2 tablets (40 mg total) by mouth daily with breakfast. 10 tablet 0  . AMBULATORY NON FORMULARY MEDICATION I prescribe the purchase of a hot tub as medically necessary for pain in the arm, shoulder, neck and upper back due to a rotator cuff tear. 1 each 0  . amoxicillin (AMOXIL) 500 MG capsule Take 1 capsule (500 mg total) by mouth 2 (two) times daily. 20 capsule 0  . Cholecalciferol (VITAMIN D PO) Take 1 tablet by mouth daily.    . furosemide (LASIX) 20 MG tablet Take 1 tablet (20 mg total) by mouth 2 (two) times daily as needed. 180 tablet 1  . Liraglutide -Weight Management (SAXENDA) 18 MG/3ML SOPN 0.6 mg [place] qd x 1 week; then increase by 0.6 mg/ day to reach target 3 mg [place] daily 3 mL 2  . losartan (COZAAR) 100 MG tablet TAKE 1 TABLET BY MOUTH EVERY DAY 90 tablet 1  . MILK THISTLE PO Take by mouth daily.  Patient takes 2 tablets daily.    . mometasone (ELOCON) 0.1 % cream Apply 1 application topically 2 (two) times daily as needed. 45 g 0  . Multiple Vitamins-Minerals (MULTIVITAMIN PO) Take 1 tablet by mouth daily.    . Nutritional Supplements (JUICE PLUS FIBRE PO) Take by mouth daily.    Marland Kitchen OVER THE COUNTER MEDICATION daily. Red Beet Capsules     No current facility-administered medications for this visit.    Allergies: Codeine, Diovan [valsartan], Lisinopril, Metformin and related, and Sulfa antibiotics  Past Medical History:  Diagnosis Date  . Anxiety   . Atrial fibrillation ([place])   . Chronic headaches   . Depression   . Hypertension   . Palpitations     No past surgical history on file.  Family History  Problem Relation Age of Onset  . Depression Sister        situational  . Depression Son   . Suicidality Son   . Crohn's disease Mother   . Diverticulitis Mother   . Hypertension Father   . Diabetes Father   . Heart disease Father   . Hyperlipidemia Maternal Grandmother   . Hypertension Maternal Grandmother   . Osteoporosis Maternal Grandmother   . Stroke Maternal Grandfather   . Colon cancer Paternal Grandmother   . Heart disease Paternal Grandfather   . Heart attack Paternal Grandfather   . Alcohol abuse Neg  Hx     Social History   Tobacco Use  . Smoking status: Never Smoker  . Smokeless tobacco: Never Used  Substance Use Topics  . Alcohol use: Yes    Comment: Very RARELY    Subjective:   I connected with Jodi Bailey on 01/02/21 at  8:40 AM EST by a video enabled telemedicine application and verified that I am speaking with the correct person using two identifiers.   I discussed the limitations of evaluation and management by telemedicine and the availability of in person appointments. The patient expressed understanding and agreed to proceed. Provider in office/ patient is at home; provider and patient are only 2 people on video call.   Tested positive  for COVID on 12/30/20; + cough/ congestion; has had to use nebulizer; concerned about progressing into bronchitis; has been running persistent fevers- down to 101 today;    Objective:  There were no vitals filed for this visit.  General: Well developed, well nourished, in no acute distress  Head: Normocephalic and atraumatic  Lungs: Respirations unlabored;  Neurologic: Alert and oriented; speech intact; face symmetrical; moves all extremities well; CNII-XII intact without focal deficit   Assessment:  1. COVID-19   2. Acute bronchitis, unspecified organism     Plan:  Rx for Prednisone 40 mg qd x 5 days; Rx for Tessalon Perles 100 mg tid prn; refill on albuterol nebulizer; increase fluids, rest and follow up worse, no better; if symptoms persist, to add antibiotic;   No follow-ups on file.  No orders of the defined types were placed in this encounter.   Requested Prescriptions   Signed Prescriptions Disp Refills  . predniSONE (DELTASONE) 20 MG tablet 10 tablet 0    Sig: Take 2 tablets (40 mg total) by mouth daily with breakfast.  . benzonatate (TESSALON) 100 MG capsule 20 capsule 0    Sig: Take 1 capsule (100 mg total) by mouth 3 (three) times daily as needed.  Marland Kitchen albuterol (PROVENTIL) (2.5 MG/3ML) 0.083% nebulizer solution 150 mL 1    Sig: Take 3 mLs (2.5 mg total) by nebulization every 6 (six) hours as needed for wheezing or shortness of breath.

## 2021-03-07 ENCOUNTER — Other Ambulatory Visit: Payer: Self-pay

## 2021-03-07 ENCOUNTER — Encounter: Payer: Self-pay | Admitting: Family

## 2021-03-07 ENCOUNTER — Ambulatory Visit (INDEPENDENT_AMBULATORY_CARE_PROVIDER_SITE_OTHER): Payer: BC Managed Care – PPO | Admitting: Family

## 2021-03-07 VITALS — BP 132/80 | HR 78 | Temp 98.6°F | Ht 67.0 in | Wt 268.0 lb

## 2021-03-07 DIAGNOSIS — Z1231 Encounter for screening mammogram for malignant neoplasm of breast: Secondary | ICD-10-CM | POA: Diagnosis not present

## 2021-03-07 DIAGNOSIS — Z1211 Encounter for screening for malignant neoplasm of colon: Secondary | ICD-10-CM

## 2021-03-07 DIAGNOSIS — R1032 Left lower quadrant pain: Secondary | ICD-10-CM

## 2021-03-07 LAB — CBC WITH DIFFERENTIAL/PLATELET
Basophils Absolute: 0.1 10*3/uL (ref 0.0–0.1)
Basophils Relative: 0.8 % (ref 0.0–3.0)
Eosinophils Absolute: 0.3 10*3/uL (ref 0.0–0.7)
Eosinophils Relative: 4.1 % (ref 0.0–5.0)
HCT: 44.3 % (ref 36.0–46.0)
Hemoglobin: 14.8 g/dL (ref 12.0–15.0)
Lymphocytes Relative: 31.2 % (ref 12.0–46.0)
Lymphs Abs: 2.2 10*3/uL (ref 0.7–4.0)
MCHC: 33.5 g/dL (ref 30.0–36.0)
MCV: 88.7 fl (ref 78.0–100.0)
Monocytes Absolute: 0.4 10*3/uL (ref 0.1–1.0)
Monocytes Relative: 6.3 % (ref 3.0–12.0)
Neutro Abs: 4 10*3/uL (ref 1.4–7.7)
Neutrophils Relative %: 57.6 % (ref 43.0–77.0)
Platelets: 266 10*3/uL (ref 150.0–400.0)
RBC: 4.99 Mil/uL (ref 3.87–5.11)
RDW: 14.2 % (ref 11.5–15.5)
WBC: 6.9 10*3/uL (ref 4.0–10.5)

## 2021-03-07 LAB — COMPREHENSIVE METABOLIC PANEL
ALT: 26 U/L (ref 0–35)
AST: 24 U/L (ref 0–37)
Albumin: 4.2 g/dL (ref 3.5–5.2)
Alkaline Phosphatase: 110 U/L (ref 39–117)
BUN: 17 mg/dL (ref 6–23)
CO2: 29 mEq/L (ref 19–32)
Calcium: 9.6 mg/dL (ref 8.4–10.5)
Chloride: 104 mEq/L (ref 96–112)
Creatinine, Ser: 0.93 mg/dL (ref 0.40–1.20)
GFR: 69.91 mL/min (ref >60.00)
Glucose, Bld: 102 mg/dL — ABNORMAL HIGH (ref 70–99)
Potassium: 4.9 mEq/L (ref 3.5–5.1)
Sodium: 140 mEq/L (ref 135–145)
Total Bilirubin: 1.1 mg/dL (ref 0.2–1.2)
Total Protein: 7.1 g/dL (ref 6.0–8.3)

## 2021-03-07 LAB — URINALYSIS
Bilirubin Urine: NEGATIVE
Hgb urine dipstick: NEGATIVE
Ketones, ur: NEGATIVE
Leukocytes,Ua: NEGATIVE
Nitrite: NEGATIVE
Specific Gravity, Urine: 1.025 (ref 1.000–1.030)
Total Protein, Urine: NEGATIVE
Urine Glucose: NEGATIVE
Urobilinogen, UA: 0.2 (ref 0.0–1.0)
pH: 6 (ref 5.0–8.0)

## 2021-03-07 NOTE — Progress Notes (Signed)
Jodi Bailey is a 55 y.o. female with the following history as recorded in EpicCare:  Patient Active Problem List   Diagnosis Date Noted  . Paroxysmal atrial fibrillation ([place]) 06/18/2019  . Morbid obesity ([place]) 06/18/2019  . Hypertension 02/15/2017  . Headache 01/27/2017  . Bilateral swelling of feet and ankles 01/02/2017  . Routine general medical examination at a health care facility 10/15/2016  . Otalgia of both ears 09/26/2016  . Headache(784.0) 07/13/2014  . Arrhythmia 07/13/2014  . Dizziness 07/13/2014  . Obesity (BMI 30-39.9) 03/16/2014  . Depression 08/31/2013  . LOW BACK PAIN SYNDROME 02/18/2011  . SKIN CANCER, HX OF 02/18/2011  . BACK PAIN 02/04/2011    Current Outpatient Medications  Medication Sig Dispense Refill  . Cholecalciferol (VITAMIN D PO) Take 1 tablet by mouth daily.    Marland Kitchen losartan (COZAAR) 100 MG tablet TAKE 1 TABLET BY MOUTH EVERY DAY 90 tablet 1  . MILK THISTLE PO Take by mouth daily. Patient takes 2 tablets daily.    . mometasone (ELOCON) 0.1 % cream Apply 1 application topically 2 (two) times daily as needed. 45 g 0  . Multiple Vitamins-Minerals (MULTIVITAMIN PO) Take 1 tablet by mouth daily.    . Nutritional Supplements (JUICE PLUS FIBRE PO) Take by mouth daily.    Marland Kitchen OVER THE COUNTER MEDICATION daily. Red Beet Capsules    . albuterol (PROVENTIL) (2.5 MG/3ML) 0.083% nebulizer solution Take 3 mLs (2.5 mg total) by nebulization every 6 (six) hours as needed for wheezing or shortness of breath. (Patient not taking: Reported on 03/07/2021) 150 mL 1  . furosemide (LASIX) 20 MG tablet Take 1 tablet (20 mg total) by mouth 2 (two) times daily as needed. (Patient not taking: Reported on 03/07/2021) 180 tablet 1   No current facility-administered medications for this visit.    Allergies: Codeine, Diovan [valsartan], Lisinopril, Metformin and related, and Sulfa antibiotics  Past Medical History:  Diagnosis Date  . Anxiety   . Atrial fibrillation ([place])   .  Chronic headaches   . Depression   . Hypertension   . Palpitations     No past surgical history on file.  Family History  Problem Relation Age of Onset  . Depression Sister        situational  . Depression Son   . Suicidality Son   . Crohn's disease Mother   . Diverticulitis Mother   . Hypertension Father   . Diabetes Father   . Heart disease Father   . Hyperlipidemia Maternal Grandmother   . Hypertension Maternal Grandmother   . Osteoporosis Maternal Grandmother   . Stroke Maternal Grandfather   . Colon cancer Paternal Grandmother   . Heart disease Paternal Grandfather   . Heart attack Paternal Grandfather   . Alcohol abuse Neg Hx     Social History   Tobacco Use  . Smoking status: Never Smoker  . Smokeless tobacco: Never Used  Substance Use Topics  . Alcohol use: Yes    Comment: Very RARELY    Subjective:  LLQ pain x 1 week; initially started last week- describes as "dull ache" which can progress into episodes of "sharp pain." No changes in bowel movements; no prior history of kidney stones; no changes in bowel or bladder habits; + nausea- not constant- "just weird waves."  Overdue for baseline colonoscopy; LMP- post menopausal  Objective:  Vitals:   03/07/21 0841  BP: 132/80  Pulse: 78  Temp: 98.6 F (37 C)  TempSrc: Oral  SpO2: 97%  Weight: 268 lb (121.6 kg)  Height: $Remove'5\' 7"'vDsbrRh$  (1.702 m)    General: Well developed, well nourished, in no acute distress  Skin : Warm and dry.  Head: Normocephalic and atraumatic  Eyes: Sclera and conjunctiva clear; pupils round and reactive to light; extraocular movements intact  Ears: External normal; canals clear; tympanic membranes normal  Oropharynx: Pink, supple. No suspicious lesions  Neck: Supple without thyromegaly, adenopathy  Lungs: Respirations unlabored;  Abdomen: Soft; nontender; nondistended; normoactive bowel sounds; no masses or hepatosplenomegaly  Musculoskeletal: No deformities; no active joint inflammation   Extremities: No edema, cyanosis, clubbing  Vessels: Symmetric bilaterally  Neurologic: Alert and oriented; speech intact; face symmetrical; moves all extremities well; CNII-XII intact without focal deficit   Assessment:  1. LLQ pain   2. Encounter for screening colonoscopy   3. Screening mammogram for breast cancer     Plan:  1.  ? Diverticulitis; check abd/ pelvic CT; check CBC, CMP, U/A; follow-up to be determined; 2. Referral updated; 3. Referral updated;  This visit occurred during the SARS-CoV-2 public health emergency.  Safety protocols were in place, including screening questions prior to the visit, additional usage of staff PPE, and extensive cleaning of exam room while observing appropriate contact time as indicated for disinfecting solutions.     No follow-ups on file.  Orders Placed This Encounter  Procedures  . MM Digital Screening    Standing Status:   Future    Standing Expiration Date:   03/07/2022    Order Specific Question:   Reason for Exam (SYMPTOM  OR DIAGNOSIS REQUIRED)    Answer:   screening mammogram    Order Specific Question:   Is the patient pregnant?    Answer:   No    Order Specific Question:   Preferred imaging location?    Answer:   [facility]  . CT Abdomen Pelvis W Contrast    Lori/TR Stat Add On [employer]  Looking For Diverticulitis Getting Labs 03/07/21 HTN No DM    Standing Status:   Future    Standing Expiration Date:   03/07/2022    Order Specific Question:   If indicated for the ordered procedure, I authorize the administration of contrast media per Radiology protocol    Answer:   Yes    Order Specific Question:   Is patient pregnant?    Answer:   No    Order Specific Question:   Preferred imaging location?    Answer:   [facility]    Order Specific Question:   Is Oral Contrast requested for this exam?    Answer:   Yes, Per Radiology protocol  . CBC with Differential/Platelet    Standing Status:   Future    Number of  Occurrences:   1    Standing Expiration Date:   03/07/2022  . Comp Met (CMET)    Standing Status:   Future    Number of Occurrences:   1    Standing Expiration Date:   03/07/2022  . Urinalysis    Standing Status:   Future    Number of Occurrences:   1    Standing Expiration Date:   03/07/2022  . Ambulatory referral to Gastroenterology    Referral Priority:   Routine    Referral Type:   Consultation    Referral Reason:   Specialty Services Required    Number of Visits Requested:   1    Requested Prescriptions    No prescriptions requested  or ordered in this encounter

## 2021-03-08 ENCOUNTER — Ambulatory Visit (INDEPENDENT_AMBULATORY_CARE_PROVIDER_SITE_OTHER)
Admission: RE | Admit: 2021-03-08 | Discharge: 2021-03-08 | Disposition: A | Discharge status: Home / Self Care | Payer: BC Managed Care – PPO | Source: Ambulatory Visit | Attending: Family | Admitting: Family

## 2021-03-08 DIAGNOSIS — R1032 Left lower quadrant pain: Secondary | ICD-10-CM | POA: Diagnosis not present

## 2021-03-08 DIAGNOSIS — R110 Nausea: Secondary | ICD-10-CM | POA: Diagnosis not present

## 2021-03-08 DIAGNOSIS — K7689 Other specified diseases of liver: Secondary | ICD-10-CM | POA: Diagnosis not present

## 2021-03-08 MED ORDER — IOHEXOL 300 MG/ML  SOLN
100.0000 mL | Freq: Once | INTRAMUSCULAR | Status: AC | PRN
  Administered 2021-03-08: 100 mL via INTRAVENOUS

## 2021-03-09 ENCOUNTER — Other Ambulatory Visit: Payer: Self-pay | Admitting: Family

## 2021-03-09 DIAGNOSIS — M545 Low back pain, unspecified: Secondary | ICD-10-CM

## 2021-04-18 ENCOUNTER — Ambulatory Visit: Payer: BC Managed Care – PPO

## 2021-06-13 ENCOUNTER — Other Ambulatory Visit: Payer: Self-pay | Admitting: Family

## 2021-06-15 ENCOUNTER — Ambulatory Visit
Admission: RE | Admit: 2021-06-15 | Discharge: 2021-06-15 | Disposition: A | Discharge status: Home / Self Care | Payer: BC Managed Care – PPO | Source: Ambulatory Visit | Attending: Family | Admitting: Family

## 2021-06-15 ENCOUNTER — Other Ambulatory Visit: Payer: Self-pay

## 2021-06-15 DIAGNOSIS — Z1231 Encounter for screening mammogram for malignant neoplasm of breast: Secondary | ICD-10-CM | POA: Diagnosis not present

## 2021-12-23 ENCOUNTER — Other Ambulatory Visit: Payer: Self-pay | Admitting: Family

## 2022-01-14 IMAGING — DX DG HUMERUS 2V *L*
2 series · 2 of 2 positions shown · non-contrast
Comparison: None.

CLINICAL DATA: Left shoulder pain after recent fall.

EXAM:
LEFT HUMERUS - 2+ VIEW

[humerus ap]
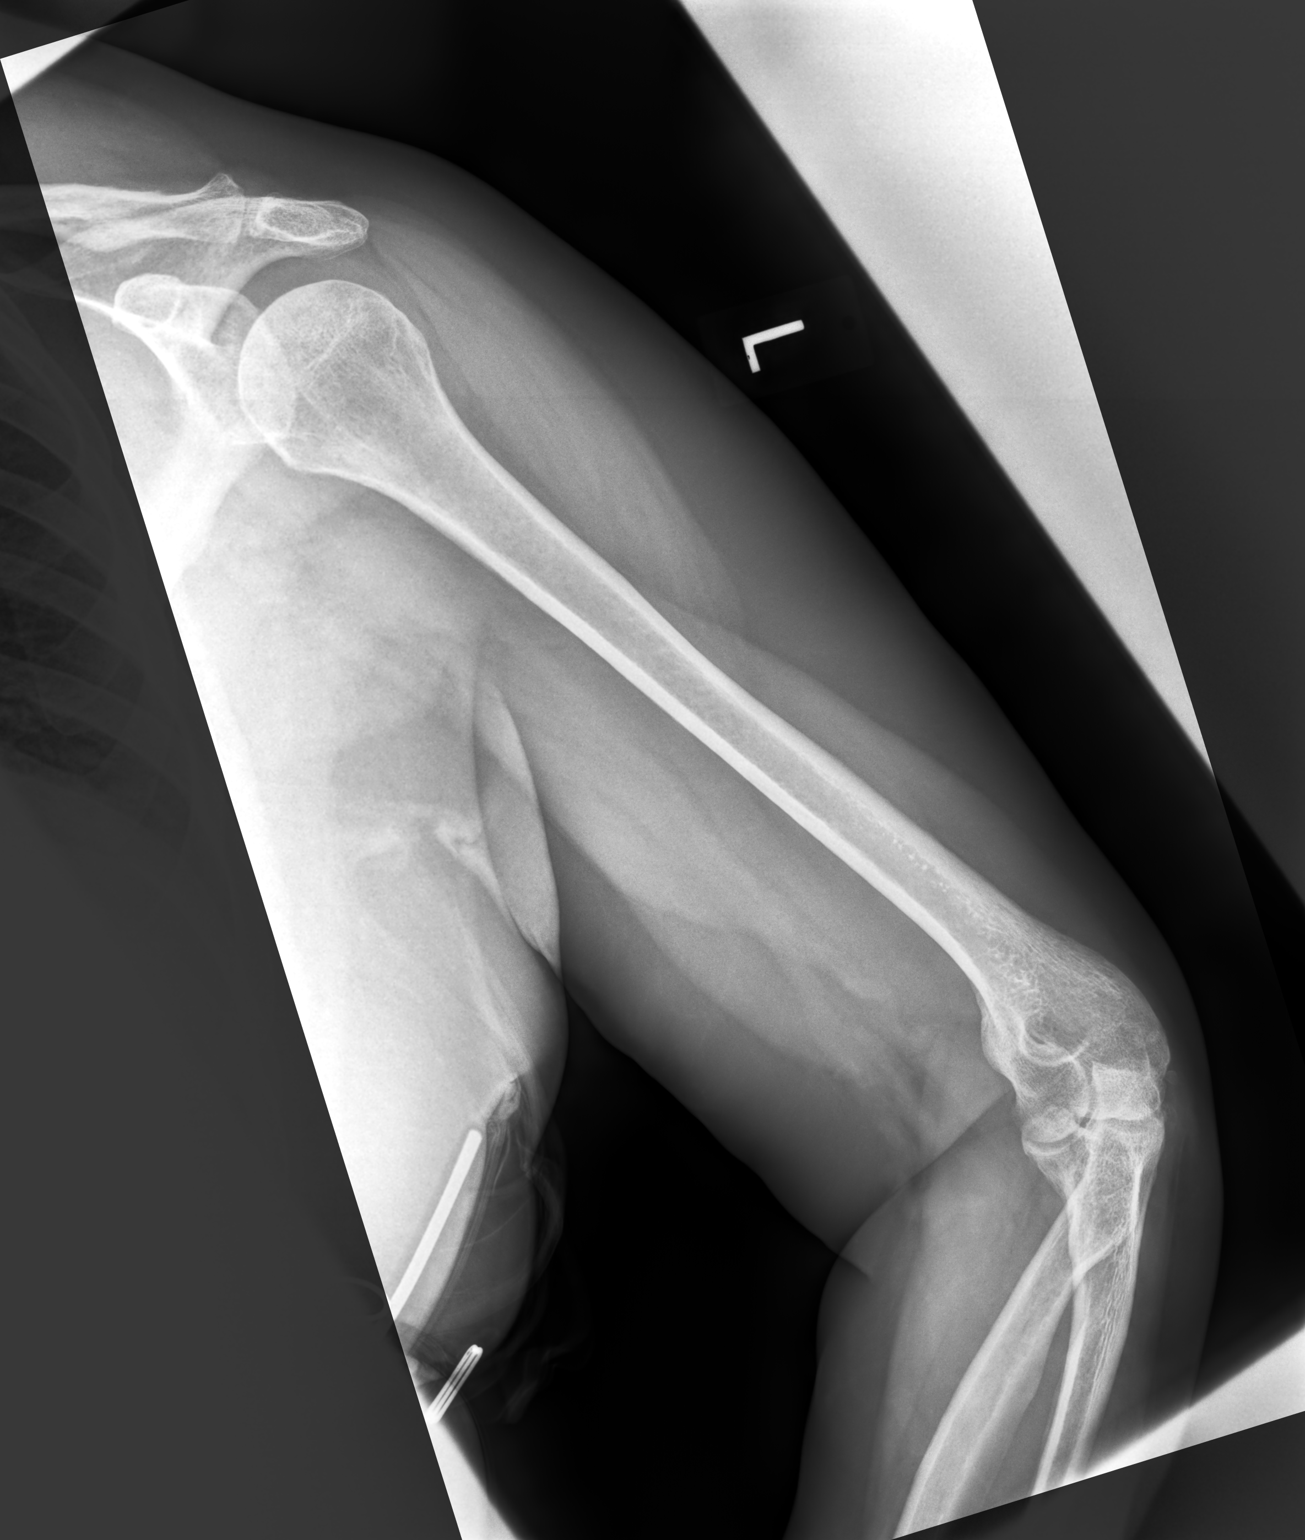

[humerus lat]
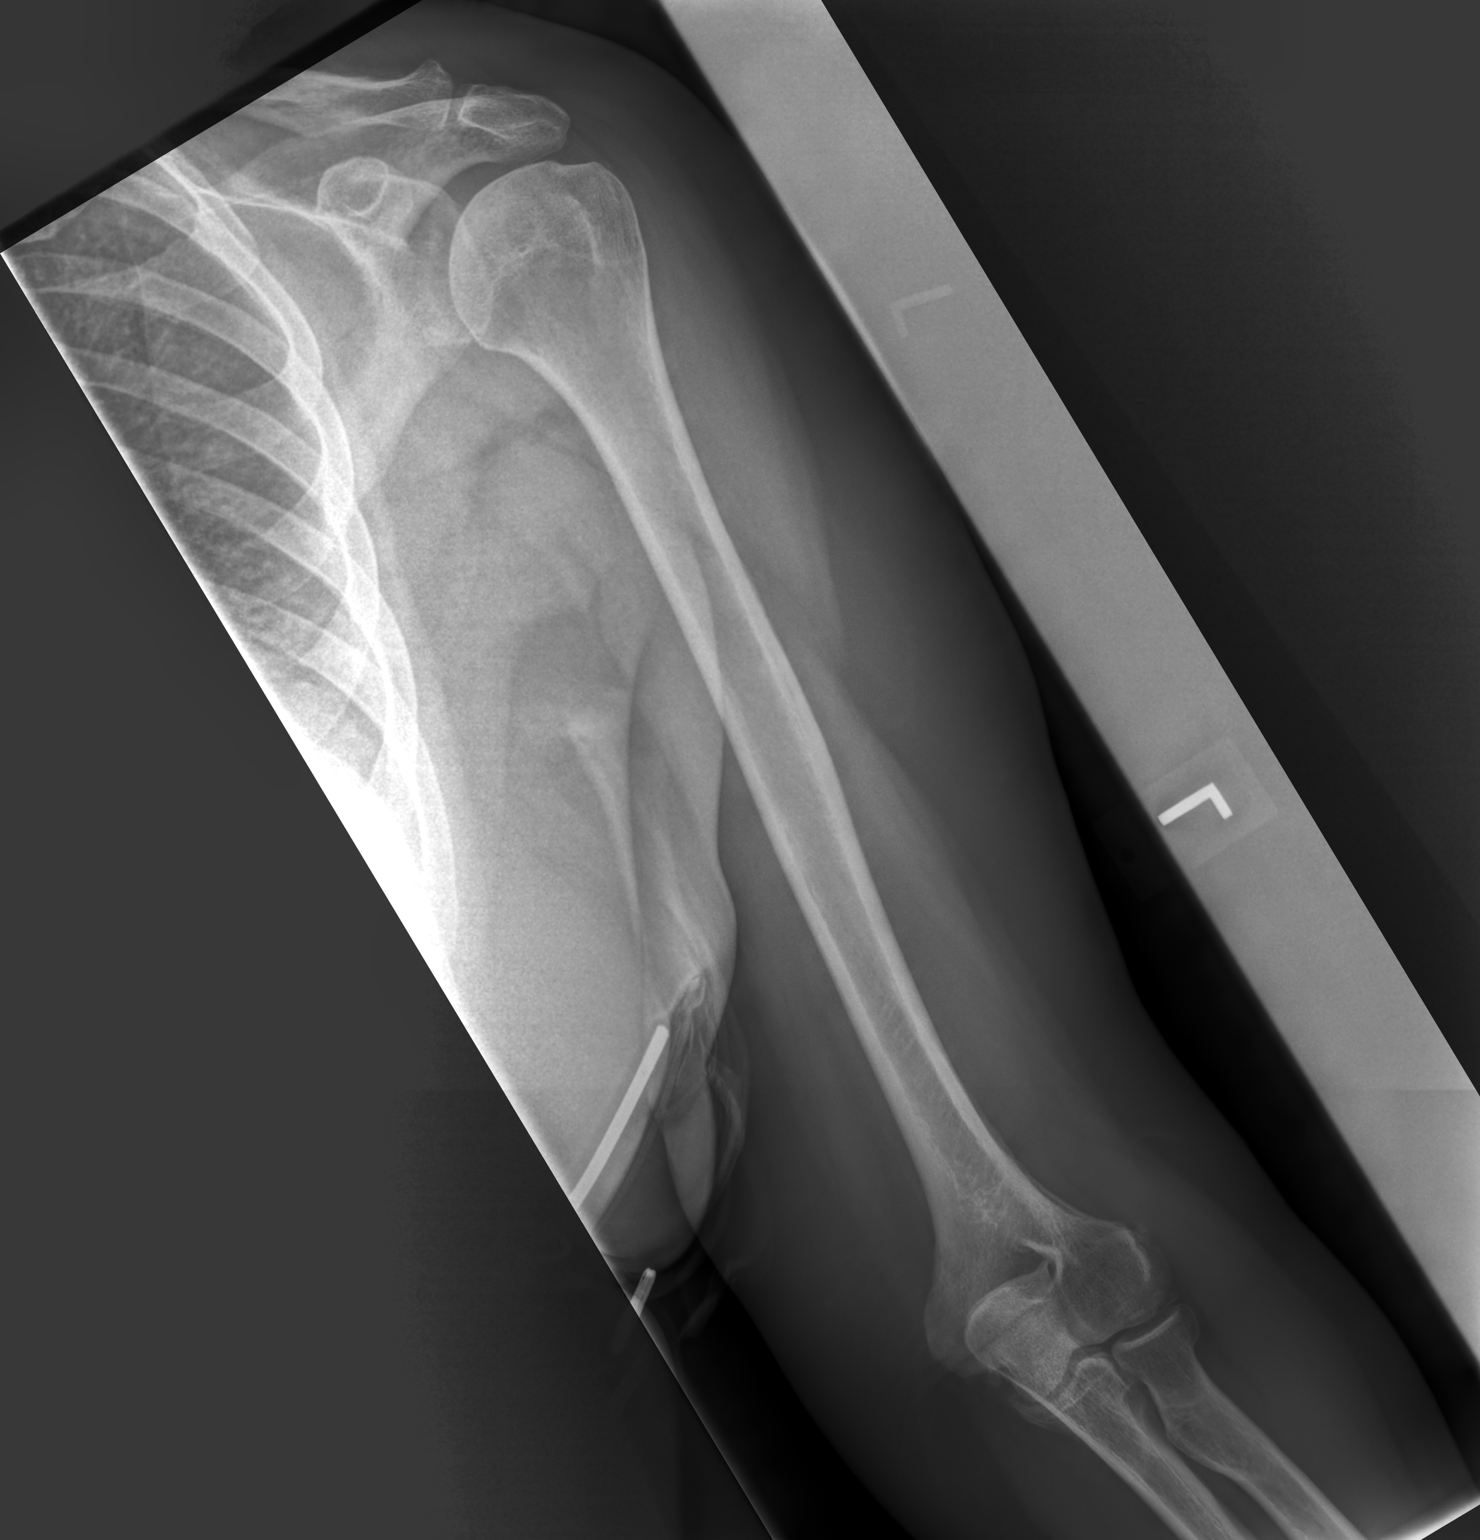

[2 of 2 positions shown; findings below may reference images not displayed]

FINDINGS: There is no evidence of fracture or other focal bone lesions. Soft
tissues are unremarkable.
IMPRESSION: Negative.

## 2022-03-27 ENCOUNTER — Other Ambulatory Visit: Payer: Self-pay | Admitting: Family

## 2022-08-23 ENCOUNTER — Other Ambulatory Visit (INDEPENDENT_AMBULATORY_CARE_PROVIDER_SITE_OTHER): Payer: BC Managed Care – PPO

## 2022-08-23 ENCOUNTER — Encounter: Payer: Self-pay | Admitting: Family

## 2022-08-23 ENCOUNTER — Ambulatory Visit (INDEPENDENT_AMBULATORY_CARE_PROVIDER_SITE_OTHER): Payer: BC Managed Care – PPO | Admitting: Family

## 2022-08-23 VITALS — BP 154/96 | HR 73 | Temp 98.5°F | Ht 67.0 in | Wt 261.4 lb

## 2022-08-23 DIAGNOSIS — R7309 Other abnormal glucose: Secondary | ICD-10-CM | POA: Diagnosis not present

## 2022-08-23 DIAGNOSIS — F439 Reaction to severe stress, unspecified: Secondary | ICD-10-CM

## 2022-08-23 DIAGNOSIS — I10 Essential (primary) hypertension: Secondary | ICD-10-CM | POA: Diagnosis not present

## 2022-08-23 LAB — COMPREHENSIVE METABOLIC PANEL
ALT: 26 U/L (ref 0–35)
AST: 23 U/L (ref 0–37)
Albumin: 4.1 g/dL (ref 3.5–5.2)
Alkaline Phosphatase: 95 U/L (ref 39–117)
BUN: 21 mg/dL (ref 6–23)
CO2: 30 mEq/L (ref 19–32)
Calcium: 9.8 mg/dL (ref 8.4–10.5)
Chloride: 101 mEq/L (ref 96–112)
Creatinine, Ser: 0.91 mg/dL (ref 0.40–1.20)
GFR: 71.02 mL/min (ref >60.00)
Glucose, Bld: 84 mg/dL (ref 70–99)
Potassium: 4.8 mEq/L (ref 3.5–5.1)
Sodium: 139 mEq/L (ref 135–145)
Total Bilirubin: 1 mg/dL (ref 0.2–1.2)
Total Protein: 7.1 g/dL (ref 6.0–8.3)

## 2022-08-23 LAB — CBC WITH DIFFERENTIAL/PLATELET
Basophils Absolute: 0.1 10*3/uL (ref 0.0–0.1)
Basophils Relative: 0.8 % (ref 0.0–3.0)
Eosinophils Absolute: 0.3 10*3/uL (ref 0.0–0.7)
Eosinophils Relative: 2.8 % (ref 0.0–5.0)
HCT: 44.9 % (ref 36.0–46.0)
Hemoglobin: 15 g/dL (ref 12.0–15.0)
Lymphocytes Relative: 23.3 % (ref 12.0–46.0)
Lymphs Abs: 2.3 10*3/uL (ref 0.7–4.0)
MCHC: 33.3 g/dL (ref 30.0–36.0)
MCV: 89.5 fl (ref 78.0–100.0)
Monocytes Absolute: 0.6 10*3/uL (ref 0.1–1.0)
Monocytes Relative: 5.7 % (ref 3.0–12.0)
Neutro Abs: 6.7 10*3/uL (ref 1.4–7.7)
Neutrophils Relative %: 67.4 % (ref 43.0–77.0)
Platelets: 244 10*3/uL (ref 150.0–400.0)
RBC: 5.02 Mil/uL (ref 3.87–5.11)
RDW: 13.6 % (ref 11.5–15.5)
WBC: 9.9 10*3/uL (ref 4.0–10.5)

## 2022-08-23 MED ORDER — LOSARTAN POTASSIUM 50 MG PO TABS: 50.0000 mg | ORAL_TABLET | Freq: Every day | ORAL | 0 refills | Status: DC

## 2022-08-23 NOTE — Progress Notes (Signed)
Jodi Bailey is a 56 y.o. female with the following history as recorded in EpicCare:  Patient Active Problem List   Diagnosis Date Noted   Paroxysmal atrial fibrillation ([place]) 06/18/2019   Morbid obesity ([place]) 06/18/2019   Hypertension 02/15/2017   Headache 01/27/2017   Bilateral swelling of feet and ankles 01/02/2017   Routine general medical examination at a health care facility 10/15/2016   Otalgia of both ears 09/26/2016   Headache(784.0) 07/13/2014   Arrhythmia 07/13/2014   Dizziness 07/13/2014   Obesity (BMI 30-39.9) 03/16/2014   Depression 08/31/2013   LOW BACK PAIN SYNDROME 02/18/2011   SKIN CANCER, HX OF 02/18/2011   BACK PAIN 02/04/2011    Current Outpatient Medications  Medication Sig Dispense Refill   losartan (COZAAR) 50 MG tablet Take 1 tablet (50 mg total) by mouth daily. 90 tablet 0   Multiple Vitamins-Minerals (MULTIVITAMIN PO) Take 1 tablet by mouth daily.     No current facility-administered medications for this visit.    Allergies: Codeine, Diovan [valsartan], Lisinopril, Metformin and related, and Sulfa antibiotics  Past Medical History:  Diagnosis Date   Anxiety    Atrial fibrillation ([facility])    Chronic headaches    Depression    Hypertension    Palpitations     No past surgical history on file.  Family History  Problem Relation Age of Onset   Depression Sister        situational   Depression Son    Suicidality Son    Crohn's disease Mother    Diverticulitis Mother    Hypertension Father    Diabetes Father    Heart disease Father    Hyperlipidemia Maternal Grandmother    Hypertension Maternal Grandmother    Osteoporosis Maternal Grandmother    Stroke Maternal Grandfather    Colon cancer Paternal Grandmother    Heart disease Paternal Grandfather    Heart attack Paternal Grandfather    Alcohol abuse Neg Hx     Social History   Tobacco Use   Smoking status: Never   Smokeless tobacco: Never  Substance Use Topics   Alcohol use: Yes     Comment: Very RARELY    Subjective:  Patient has not been seen since April 2022; was able to get her weight down to 215 pounds and was able to come off her medication; Notes that stress level has been very high recently- mother has dementia and requires 24 hour care;  Working full time and sitting with her mother/ sister at night; not able to exercise as regularly; Would like to discuss going back on Losartan but only at 50 mg if possible; home readings for patient 145/92; 143/89; 136/ 88;     Objective:  Vitals:   08/23/22 1004  BP: (!) 154/96  Pulse: 73  Temp: 98.5 F (36.9 C)  TempSrc: Oral  SpO2: 97%  Weight: 261 lb 6.4 oz (118.6 kg)  Height: _0  (1.702 m)    General: Well developed, well nourished, in no acute distress  Skin : Warm and dry.  Head: Normocephalic and atraumatic  Lungs: Respirations unlabored; clear to auscultation bilaterally without wheeze, rales, rhonchi  CVS exam: normal rate and regular rhythm.  Neurologic: Alert and oriented; speech intact; face symmetrical; moves all extremities well; CNII-XII intact without focal deficit   Assessment:  1. Primary hypertension   2. Elevated glucose   3. 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   Time spent 30 minutes  Return in about 1 month (around 09/22/2022).  Orders Placed This Encounter  Procedures   CBC with Differential/Platelet   Comp Met (CMET)   Hemoglobin A1c    Requested Prescriptions   Signed Prescriptions Disp Refills   losartan (COZAAR) 50 MG tablet  90 tablet 0    Sig: Take 1 tablet (50 mg total) by mouth daily.

## 2022-08-26 LAB — HEMOGLOBIN A1C: Hgb A1c MFr Bld: 6 % (ref 4.6–6.5)

## 2022-09-27 ENCOUNTER — Encounter: Payer: Self-pay | Admitting: Family

## 2022-09-27 ENCOUNTER — Telehealth (INDEPENDENT_AMBULATORY_CARE_PROVIDER_SITE_OTHER): Payer: BC Managed Care – PPO | Admitting: Family

## 2022-09-27 VITALS — BP 117/66 | HR 70 | Ht 68.0 in

## 2022-09-27 DIAGNOSIS — I10 Essential (primary) hypertension: Secondary | ICD-10-CM

## 2022-09-27 DIAGNOSIS — Z1231 Encounter for screening mammogram for malignant neoplasm of breast: Secondary | ICD-10-CM

## 2022-09-27 DIAGNOSIS — Z1211 Encounter for screening for malignant neoplasm of colon: Secondary | ICD-10-CM

## 2022-09-27 MED ORDER — LOSARTAN POTASSIUM 50 MG PO TABS: 50.0000 mg | ORAL_TABLET | Freq: Every day | ORAL | 1 refills | Status: DC

## 2022-09-27 NOTE — Progress Notes (Signed)
Jodi Bailey is a 56 y.o. female with the following history as recorded in EpicCare:  Patient Active Problem List   Diagnosis Date Noted   Paroxysmal atrial fibrillation ([place]) 06/18/2019   Morbid obesity ([place]) 06/18/2019   Hypertension 02/15/2017   Headache 01/27/2017   Bilateral swelling of feet and ankles 01/02/2017   Routine general medical examination at a health care facility 10/15/2016   Otalgia of both ears 09/26/2016   Headache(784.0) 07/13/2014   Arrhythmia 07/13/2014   Dizziness 07/13/2014   Obesity (BMI 30-39.9) 03/16/2014   Depression 08/31/2013   LOW BACK PAIN SYNDROME 02/18/2011   SKIN CANCER, HX OF 02/18/2011   BACK PAIN 02/04/2011    Current Outpatient Medications  Medication Sig Dispense Refill   Multiple Vitamins-Minerals (MULTIVITAMIN PO) Take 1 tablet by mouth daily.     losartan (COZAAR) 50 MG tablet Take 1 tablet (50 mg total) by mouth daily. 90 tablet 1   No current facility-administered medications for this visit.    Allergies: Codeine, Diovan [valsartan], Lisinopril, Metformin and related, and Sulfa antibiotics  Past Medical History:  Diagnosis Date   Anxiety    Atrial fibrillation ([facility])    Chronic headaches    Depression    Hypertension    Palpitations     No past surgical history on file.  Family History  Problem Relation Age of Onset   Depression Sister        situational   Depression Son    Suicidality Son    Crohn's disease Mother    Diverticulitis Mother    Hypertension Father    Diabetes Father    Heart disease Father    Hyperlipidemia Maternal Grandmother    Hypertension Maternal Grandmother    Osteoporosis Maternal Grandmother    Stroke Maternal Grandfather    Colon cancer Paternal Grandmother    Heart disease Paternal Grandfather    Heart attack Paternal Grandfather    Alcohol abuse Neg Hx     Social History   Tobacco Use   Smoking status: Never   Smokeless tobacco: Never  Substance Use Topics   Alcohol use: Yes     Comment: Very RARELY    Subjective:    I connected with Harly A Stenberg on 09/27/22 at 10:20 AM EDT by a video enabled telemedicine application and verified that I am speaking with the correct person using two identifiers.   I discussed the limitations of evaluation and management by telemedicine and the availability of in person appointments. The patient expressed understanding and agreed to proceed. Provider in office/ patient is at home; provider and patient are only 2 people on video call.   1 month follow up on hypertension- had sent readings earlier today and responding well to Losartan 50 mg; feeling much better since getting back on her medication; Denies any chest pain, shortness of breath, blurred vision or headache     Objective:  Vitals:   09/27/22 1020  BP: 117/66  Pulse: 70  Height: '5\' 8"'$  (1.727 m)    General: Well developed, well nourished, in no acute distress  Head: Normocephalic and atraumatic  Lungs: Respirations unlabored;  Neurologic: Alert and oriented; speech intact; face symmetrical; moves all extremities well; CNII-XII intact without focal deficit   Assessment:  1. Primary hypertension   2. Visit for screening mammogram   3. Encounter for screening colonoscopy     Plan:  Stable; refill updated; she will continue to monitor regularly; Order updated; Order updated;   Continue to work  on diet/ exercise goals; patient is feeling more optimistic about care programs for her mother;   Return in about 6 months (around 03/29/2023).  Orders Placed This Encounter  Procedures   MM Digital Screening    Standing Status:   Future    Standing Expiration Date:   09/28/2023    Order Specific Question:   Reason for Exam (SYMPTOM  OR DIAGNOSIS REQUIRED)    Answer:   screening mammogram    Order Specific Question:   Is the patient pregnant?    Answer:   No    Order Specific Question:   Preferred imaging location?    Answer:   [facility]   Ambulatory  referral to Gastroenterology    Referral Priority:   Routine    Referral Type:   Consultation    Referral Reason:   Specialty Services Required    Number of Visits Requested:   1    Requested Prescriptions   Signed Prescriptions Disp Refills   losartan (COZAAR) 50 MG tablet 90 tablet 1    Sig: Take 1 tablet (50 mg total) by mouth daily.

## 2022-10-22 ENCOUNTER — Ambulatory Visit
Admission: RE | Admit: 2022-10-22 | Discharge: 2022-10-22 | Disposition: A | Discharge status: Home / Self Care | Payer: BC Managed Care – PPO | Source: Ambulatory Visit | Attending: Family | Admitting: Family

## 2022-10-22 DIAGNOSIS — Z1231 Encounter for screening mammogram for malignant neoplasm of breast: Secondary | ICD-10-CM | POA: Diagnosis not present

## 2022-10-27 ENCOUNTER — Encounter: Payer: Self-pay | Admitting: Family

## 2022-10-28 ENCOUNTER — Other Ambulatory Visit: Payer: Self-pay | Admitting: Family

## 2022-10-28 DIAGNOSIS — R928 Other abnormal and inconclusive findings on diagnostic imaging of breast: Secondary | ICD-10-CM

## 2022-10-28 NOTE — Telephone Encounter (Signed)
Are you willing to put in an interpretation for the staff?

## 2022-10-30 ENCOUNTER — Encounter: Payer: Self-pay | Admitting: Family

## 2022-10-30 ENCOUNTER — Encounter: Payer: Self-pay | Admitting: Gastroenterology

## 2022-11-12 ENCOUNTER — Ambulatory Visit
Admission: RE | Admit: 2022-11-12 | Discharge: 2022-11-12 | Disposition: A | Discharge status: Home / Self Care | Payer: BC Managed Care – PPO | Source: Ambulatory Visit | Attending: Family | Admitting: Family

## 2022-11-12 ENCOUNTER — Other Ambulatory Visit: Payer: Self-pay | Admitting: Family

## 2022-11-12 DIAGNOSIS — R928 Other abnormal and inconclusive findings on diagnostic imaging of breast: Secondary | ICD-10-CM

## 2022-11-12 DIAGNOSIS — N6324 Unspecified lump in the left breast, lower inner quadrant: Secondary | ICD-10-CM | POA: Diagnosis not present

## 2022-11-12 DIAGNOSIS — N6320 Unspecified lump in the left breast, unspecified quadrant: Secondary | ICD-10-CM

## 2022-11-21 ENCOUNTER — Ambulatory Visit (AMBULATORY_SURGERY_CENTER): Payer: BC Managed Care – PPO | Admitting: *Deleted

## 2022-11-21 ENCOUNTER — Other Ambulatory Visit: Payer: Self-pay | Admitting: Gastroenterology

## 2022-11-21 VITALS — Ht 68.0 in | Wt 260.0 lb

## 2022-11-21 DIAGNOSIS — Z1211 Encounter for screening for malignant neoplasm of colon: Secondary | ICD-10-CM

## 2022-11-21 MED ORDER — NA SULFATE-K SULFATE-MG SULF 17.5-3.13-1.6 GM/177ML PO SOLN: 1.0000 | Freq: Once | ORAL | 0 refills | Status: DC

## 2022-11-21 NOTE — Progress Notes (Signed)
No egg or soy allergy known to patient  No issues known to pt with past sedation with any surgeries or procedures Patient denies ever being told they had issues or difficulty with intubation  No FH of Malignant Hyperthermia Pt is not on diet pills Pt is not on  home 02  Pt is not on blood thinners  Pt denies issues with constipation  No A fib or A flutter- afib in her 30s.  No current issues.  Have any cardiac testing pending--no Pt instructed to use Singlecare.com or GoodRx for a price reduction on prep   Patient's chart reviewed by Osvaldo Angst CNRA prior to previsit and patient appropriate for the [place].  Previsit completed and red dot placed by patient's name on their procedure day (on provider's schedule).

## 2022-12-11 ENCOUNTER — Encounter: Payer: Self-pay | Admitting: Gastroenterology

## 2022-12-13 ENCOUNTER — Encounter: Payer: Self-pay | Admitting: Gastroenterology

## 2022-12-13 ENCOUNTER — Ambulatory Visit (AMBULATORY_SURGERY_CENTER): Payer: BC Managed Care – PPO | Admitting: Gastroenterology

## 2022-12-13 VITALS — BP 104/66 | HR 69 | Temp 98.4°F | Resp 13 | Ht 68.0 in | Wt 260.0 lb

## 2022-12-13 DIAGNOSIS — Z1211 Encounter for screening for malignant neoplasm of colon: Secondary | ICD-10-CM

## 2022-12-13 DIAGNOSIS — K635 Polyp of colon: Secondary | ICD-10-CM

## 2022-12-13 DIAGNOSIS — D122 Benign neoplasm of ascending colon: Secondary | ICD-10-CM

## 2022-12-13 DIAGNOSIS — D123 Benign neoplasm of transverse colon: Secondary | ICD-10-CM

## 2022-12-13 DIAGNOSIS — D120 Benign neoplasm of cecum: Secondary | ICD-10-CM

## 2022-12-13 DIAGNOSIS — D124 Benign neoplasm of descending colon: Secondary | ICD-10-CM

## 2022-12-13 MED ORDER — SODIUM CHLORIDE 0.9 % IV SOLN: 500.0000 mL | Freq: Once | INTRAVENOUS | Status: DC

## 2022-12-13 NOTE — Progress Notes (Signed)
Called to room to assist during endoscopic procedure.  Patient ID and intended procedure confirmed with present staff. Received instructions for my participation in the procedure from the performing physician.

## 2022-12-13 NOTE — Progress Notes (Signed)
Sedate, gd SR, tolerated procedure well, VSS, report to RN 

## 2022-12-13 NOTE — Progress Notes (Signed)
Pt's states no medical or surgical changes since previsit or office visit. 

## 2022-12-13 NOTE — Op Note (Signed)
[facility] Patient Name: Jodi Bailey Procedure Date: 12/13/2022 1:33 PM MRN: [id number] Endoscopist: Nicki Reaper E. Candis Schatz , MD, [id number] Age: 57 Referring MD:  Date of Birth: August 16, 1966 Gender: Female Account #: [id number] Procedure:                Colonoscopy Indications:              Screening for colorectal malignant neoplasm, This                            is the patient's first colonoscopy Medicines:                Monitored Anesthesia Care Procedure:                Pre-Anesthesia Assessment:                           - Prior to the procedure, a History and Physical                            was performed, and patient medications and                            allergies were reviewed. The patient's tolerance of                            previous anesthesia was also reviewed. The risks                            and benefits of the procedure and the sedation                            options and risks were discussed with the patient.                            All questions were answered, and informed consent                            was obtained. Prior Anticoagulants: The patient has                            taken no anticoagulant or antiplatelet agents. ASA                            Grade Assessment: III - A patient with severe                            systemic disease. After reviewing the risks and                            benefits, the patient was deemed in satisfactory                            condition to undergo the procedure.  After obtaining informed consent, the colonoscope                            was passed under direct vision. Throughout the                            procedure, the patient's blood pressure, pulse, and                            oxygen saturations were monitored continuously. The                            CF [id number] #[id number] was introduced through the anus                            and  advanced to the the terminal ileum, with                            identification of the appendiceal orifice and IC                            valve. The colonoscopy was performed with                            difficulty due to significant looping. Successful                            completion of the procedure was aided by using                            manual pressure and withdrawing and reinserting the                            scope. The patient tolerated the procedure well.                            The quality of the bowel preparation was adequate.                            The terminal ileum, ileocecal valve, appendiceal                            orifice, and rectum were photographed. The bowel                            preparation used was SUPREP via split dose                            instruction. Scope In: 1:44:16 PM Scope Out: 2:26:21 PM Scope Withdrawal Time: 0 hours 30 minutes 38 seconds  Total Procedure Duration: 0 hours 42 minutes 5 seconds  Findings:                 The perianal and digital rectal examinations were  normal. Pertinent negatives include normal                            sphincter tone and no palpable rectal lesions.                           A 7 mm polyp was found in the descending colon. The                            polyp was sessile. The polyp was removed with a                            cold snare. Resection and retrieval were complete.                            Estimated blood loss was minimal.                           A 7 mm polyp was found in the cecum. The polyp was                            sessile. The polyp was removed with a cold snare.                            Resection and retrieval were complete. Estimated                            blood loss was minimal.                           A 6 mm polyp was found in the ascending colon. The                            polyp was sessile. The polyp was  removed with a                            cold snare. Resection and retrieval were complete.                            Estimated blood loss was minimal.                           A 5 mm polyp was found in the splenic flexure. The                            polyp was sessile. The polyp was removed with a                            cold snare. Resection and retrieval were complete.                            Estimated blood loss was minimal.  The exam was otherwise normal throughout the                            examined colon.                           The terminal ileum appeared normal.                           The retroflexed view of the distal rectum and anal                            verge was normal and showed no anal or rectal                            abnormalities. Complications:            No immediate complications. Estimated Blood Loss:     Estimated blood loss was minimal. Impression:               - One 7 mm polyp in the descending colon, removed                            with a cold snare. Resected and retrieved.                           - One 7 mm polyp in the cecum, removed with a cold                            snare. Resected and retrieved.                           - One 6 mm polyp in the ascending colon, removed                            with a cold snare. Resected and retrieved.                           - One 5 mm polyp at the splenic flexure, removed                            with a cold snare. Resected and retrieved.                           - The examined portion of the ileum was normal.                           - The distal rectum and anal verge are normal on                            retroflexion view. Recommendation:           - Patient has a contact number available for  emergencies. The signs and symptoms of potential                            delayed complications were discussed with the                             patient. Return to normal activities tomorrow.                            Written discharge instructions were provided to the                            patient.                           - Resume previous diet.                           - Continue present medications.                           - Await pathology results.                           - Repeat colonoscopy (date not yet determined) for                            surveillance based on pathology results.                           - Recommend use of abdominal binder for subsequent                            colonoscopy due to excessive looping. Liliya Fullenwider E. Candis Schatz, MD 12/13/2022 2:34:21 PM This report has been signed electronically.

## 2022-12-13 NOTE — Patient Instructions (Addendum)
Recommendation: Patient has a contact number available for                            emergencies. The signs and symptoms of potential                            delayed complications were discussed with the                            patient. Return to normal activities tomorrow.                            Written discharge instructions were provided to the                            patient.                           - Resume previous diet.                           - Continue present medications.                           - Await pathology results.                           - Repeat colonoscopy (date not yet determined) for                            surveillance based on pathology results.                           - Recommend use of abdominal binder for subsequent                            colonoscopy due to excessive looping.  Handout on Polyps given.  YOU HAD AN ENDOSCOPIC PROCEDURE TODAY AT [facility]:   Refer to the procedure report that was given to you for any specific questions about what was found during the examination.  If the procedure report does not answer your questions, please call your gastroenterologist to clarify.  If you requested that your care partner not be given the details of your procedure findings, then the procedure report has been included in a sealed envelope for you to review at your convenience later.  YOU SHOULD EXPECT: Some feelings of bloating in the abdomen. Passage of more gas than usual.  Walking can help get rid of the air that was put into your GI tract during the procedure and reduce the bloating. If you had a lower endoscopy (such as a colonoscopy or flexible sigmoidoscopy) you may notice spotting of blood in your stool or on the toilet paper. If you underwent a bowel prep for your procedure, you may not have a normal bowel movement for a few days.  Please Note:  You might notice some irritation and congestion in your nose or some  drainage.  This is from the oxygen used during your procedure.  There is no need for concern and it should clear up in a day or so.  SYMPTOMS TO REPORT IMMEDIATELY:  Following lower endoscopy (colonoscopy or flexible sigmoidoscopy):  Excessive amounts of blood in the stool  Significant tenderness or worsening of abdominal pains  Swelling of the abdomen that is new, acute  Fever of 100F or higher  For urgent or emergent issues, a gastroenterologist can be reached at any hour by calling [phone]. Do not use MyChart messaging for urgent concerns.   DIET:  We do recommend a small meal at first, but then you may proceed to your regular diet.  Drink plenty of fluids but you should avoid alcoholic beverages for 24 hours.  ACTIVITY:  You should plan to take it easy for the rest of today and you should NOT DRIVE or use heavy machinery until tomorrow (because of the sedation medicines used during the test).    FOLLOW UP: Our staff will call the number listed on your records the next business day following your procedure.  We will call around 7:15- 8:00 am to check on you and address any questions or concerns that you may have regarding the information given to you following your procedure. If we do not reach you, we will leave a message.     If any biopsies were taken you will be contacted by phone or by letter within the next 1-3 weeks.  Please call us at [phone] if you have not heard about the biopsies in 3 weeks.    SIGNATURES/CONFIDENTIALITY: You and/or your care partner have signed paperwork which will be entered into your electronic medical record.  These signatures attest to the fact that that the information above on your After Visit Summary has been reviewed and is understood.  Full responsibility of the confidentiality of this discharge information lies with you and/or your care-partner.

## 2022-12-13 NOTE — Progress Notes (Signed)
[facility] History and Physical   Primary Care Physician:  Marrian Salvage, [place]   Reason for Procedure:   Colon cancer screening  Plan:    Screening colonoscopy     HPI: Jodi Bailey is a 57 y.o. female undergoing initial average risk screening colonoscopy.  She has no family history of colon cancer and no chronic GI symptoms.    Past Medical History:  Diagnosis Date   Anxiety    Atrial fibrillation ([place])    in her 56s   Cancer [facility])    Chronic headaches    Depression    Hypertension    Palpitations     Past Surgical History:  Procedure Laterality Date   CESAREAN SECTION  1997   2004, 2008   tumor removed     upper right hip area (as a child)   WISDOM TOOTH EXTRACTION      Prior to Admission medications   Medication Sig Start Date End Date Taking? Authorizing Provider  losartan (COZAAR) 50 MG tablet Take 1 tablet (50 mg total) by mouth daily. 09/27/22  Yes Marrian Salvage, FNP  Multiple Vitamins-Minerals (MULTIVITAMIN PO) Take 1 tablet by mouth daily.    [provider]    Current Outpatient Medications  Medication Sig Dispense Refill   losartan (COZAAR) 50 MG tablet Take 1 tablet (50 mg total) by mouth daily. 90 tablet 1   Multiple Vitamins-Minerals (MULTIVITAMIN PO) Take 1 tablet by mouth daily.     Current Facility-Administered Medications  Medication Dose Route Frequency Provider Last Rate Last Admin   0.9 %  sodium chloride infusion  500 mL Intravenous Once Daryel November, MD        Allergies as of 12/13/2022 - Review Complete 12/13/2022  Allergen Reaction Noted   Codeine  02/04/2011   Diovan [valsartan]  05/05/2018   Lisinopril  06/07/2019   Metformin and related  11/08/2020   Sulfa antibiotics Itching and Rash 01/13/2017    Family History  Problem Relation Age of Onset   Crohn's disease Mother    Diverticulitis Mother    Hypertension Father    Diabetes Father    Heart disease Father     Depression Sister        situational   Colon cancer Maternal Uncle    Hyperlipidemia Maternal Grandmother    Hypertension Maternal Grandmother    Osteoporosis Maternal Grandmother    Stroke Maternal Grandfather    Colon cancer Paternal Grandmother    Heart disease Paternal Grandfather    Heart attack Paternal Grandfather    Depression Son    Suicidality Son    Alcohol abuse Neg Hx    Stomach cancer Neg Hx    Esophageal cancer Neg Hx     Social History   Socioeconomic History   Marital status: Divorced    Spouse name: Not on file   Number of children: 3   Years of education: 16   Highest education level: Not on file  Occupational History   Occupation: Part time employment  Tobacco Use   Smoking status: Never   Smokeless tobacco: Never  Vaping Use   Vaping Use: Never used  Substance and Sexual Activity   Alcohol use: Yes    Comment: Very RARELY   Drug use: No   Sexual activity: Not on file  Other Topics Concern   Not on file  Social History Narrative   Fun: [place] [employer] out with family. Anything outdoors.    Denies abuse and feels  safe at home.    Social Determinants of Health   Financial Resource Strain: Not on file  Food Insecurity: Not on file  Transportation Needs: Not on file  Physical Activity: Not on file  Stress: Not on file  Social Connections: Not on file  Intimate Partner Violence: Not on file    Review of Systems:  All other review of systems negative except as mentioned in the HPI.  Physical Exam: Vital signs BP (!) 108/58   Pulse 67   Temp 98.4 F (36.9 C)   Ht '5\' 8"'$  (1.727 m)   Wt 260 lb (117.9 kg)   LMP 02/03/2017   SpO2 96%   BMI 39.53 kg/m   General:   Alert,  Well-developed, well-nourished, pleasant and cooperative in NAD Airway:  Mallampati 2 Lungs:  Clear throughout to auscultation.   Heart:  Regular rate and rhythm; no murmurs, clicks, rubs,  or gallops. Abdomen:  Soft, nontender and nondistended. Normal bowel sounds.    Neuro/Psych:  Normal mood and affect. A and O x 3   Tariana Moldovan E. Candis Schatz, MD [facility]

## 2022-12-16 ENCOUNTER — Telehealth: Payer: Self-pay

## 2022-12-16 NOTE — Telephone Encounter (Signed)
  Follow up Call-     12/13/2022   12:57 PM  Call back number  Post procedure Call Back phone  # [phone]  Permission to leave phone message Yes     Patient questions:  Do you have a fever, pain , or abdominal swelling? No. Pain Score  0 *  Have you tolerated food without any problems? Yes.    Have you been able to return to your normal activities? Yes.    Do you have any questions about your discharge instructions: Diet   No. Medications  No. Follow up visit  No.  Do you have questions or concerns about your Care? No.  Actions: * If pain score is 4 or above: No action needed, pain <4.

## 2022-12-24 DIAGNOSIS — G43B0 Ophthalmoplegic migraine, not intractable: Secondary | ICD-10-CM | POA: Diagnosis not present

## 2022-12-31 NOTE — Progress Notes (Signed)
Jodi Bailey,   Several polyps that I removed during your recent procedure were completely benign but were proven to be "pre-cancerous" polyps that MAY have grown into cancers if they had not been removed.  Studies shows that at least 20% of women over age 56 and 30% of men over age 4 have pre-cancerous polyps.  Based on current nationally recognized surveillance guidelines, I recommend that you have a repeat colonoscopy in 5 years.   If you develop any new rectal bleeding, abdominal pain or significant bowel habit changes, please contact me before then.

## 2023-01-20 DIAGNOSIS — H43392 Other vitreous opacities, left eye: Secondary | ICD-10-CM | POA: Diagnosis not present

## 2023-01-20 DIAGNOSIS — G43B0 Ophthalmoplegic migraine, not intractable: Secondary | ICD-10-CM | POA: Diagnosis not present

## 2023-03-28 IMAGING — MG MM DIGITAL SCREENING BILAT W/ TOMO AND CAD
6 of 12 series · 6 of 36 positions shown · non-contrast
Comparison: Previous exam(s).

CLINICAL DATA: Screening.

EXAM:
DIGITAL SCREENING BILATERAL MAMMOGRAM WITH TOMOSYNTHESIS AND CAD
TECHNIQUE: Bilateral screening digital craniocaudal and mediolateral oblique
mammograms were obtained. Bilateral screening digital breast
tomosynthesis was performed. The images were evaluated with
computer-aided detection.

[R MLO synth-2D (1 of 2)]
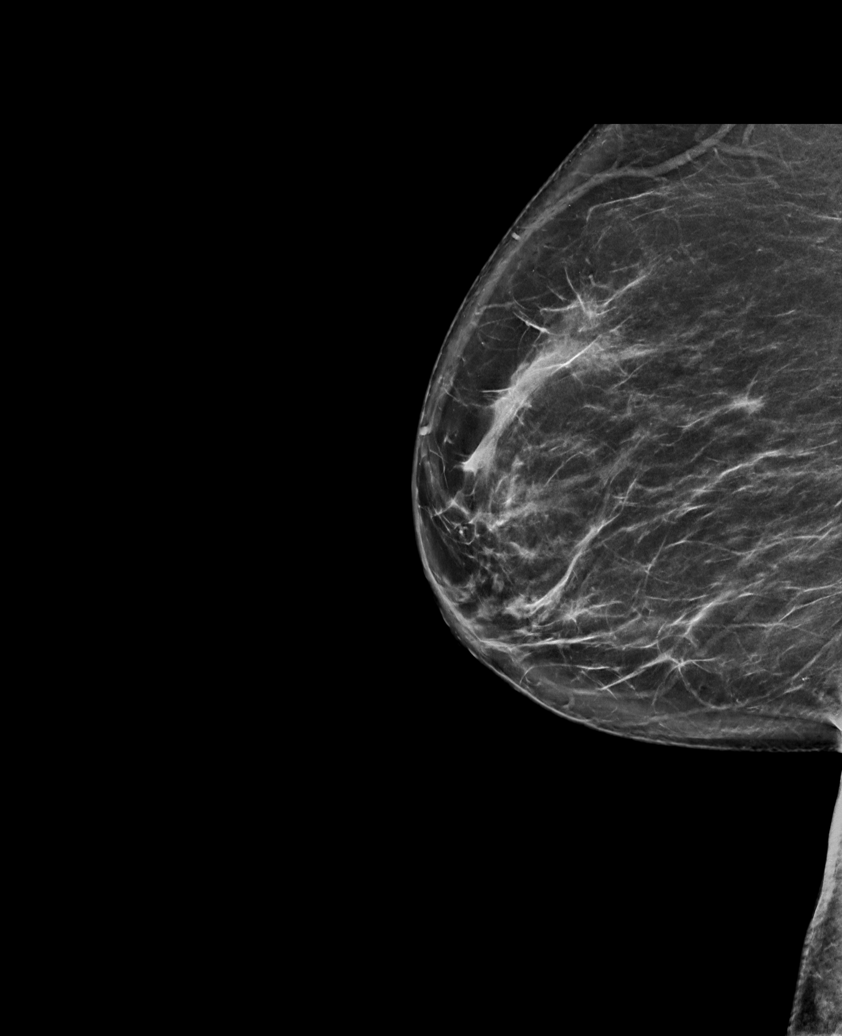

[R CV synth-2D]
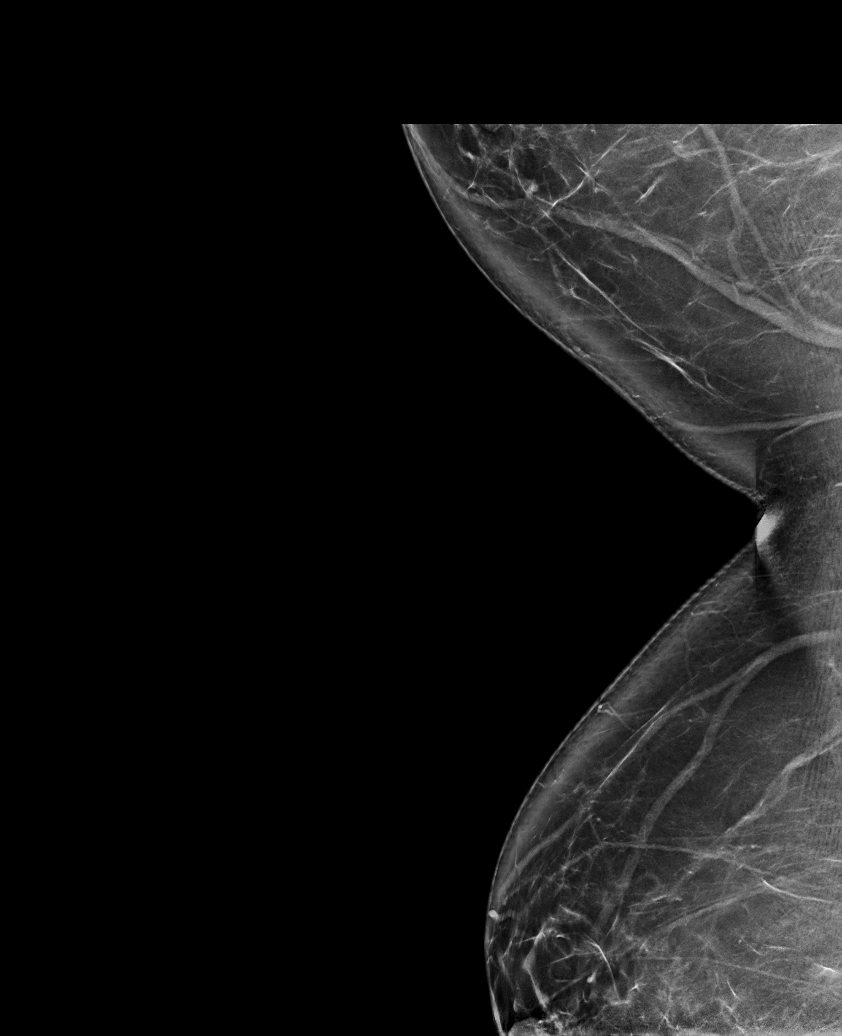

[L CC synth-2D]
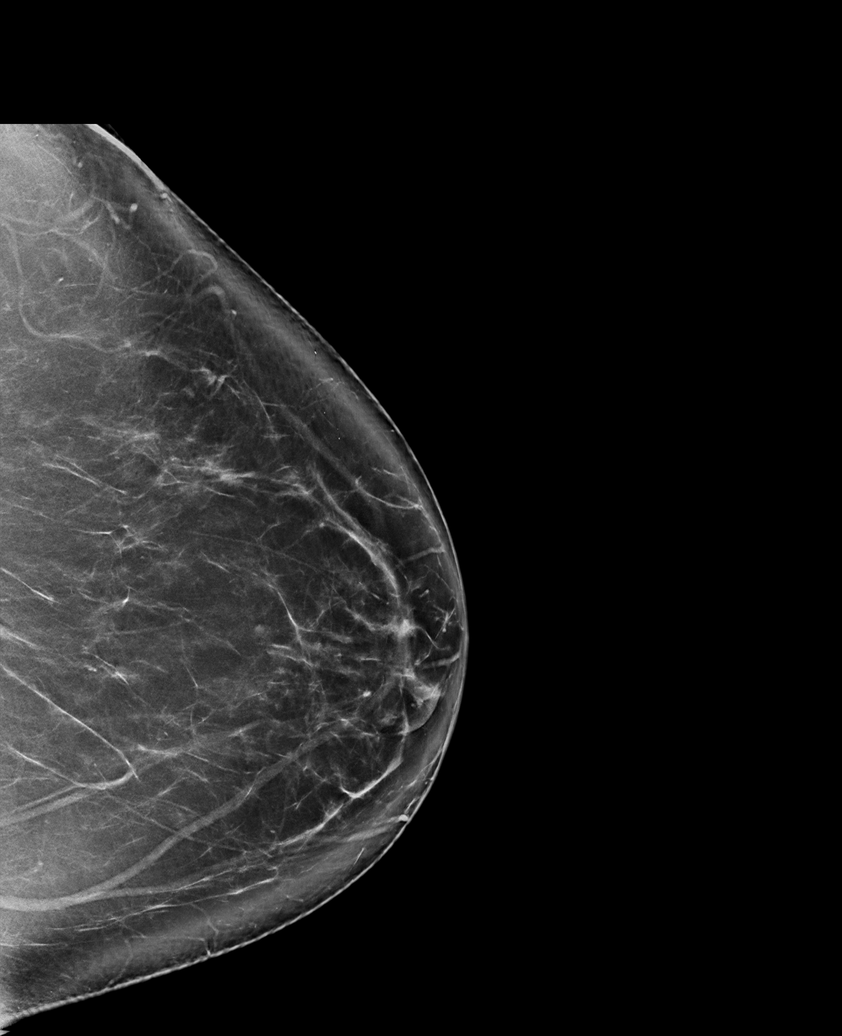

[L MLO synth-2D]
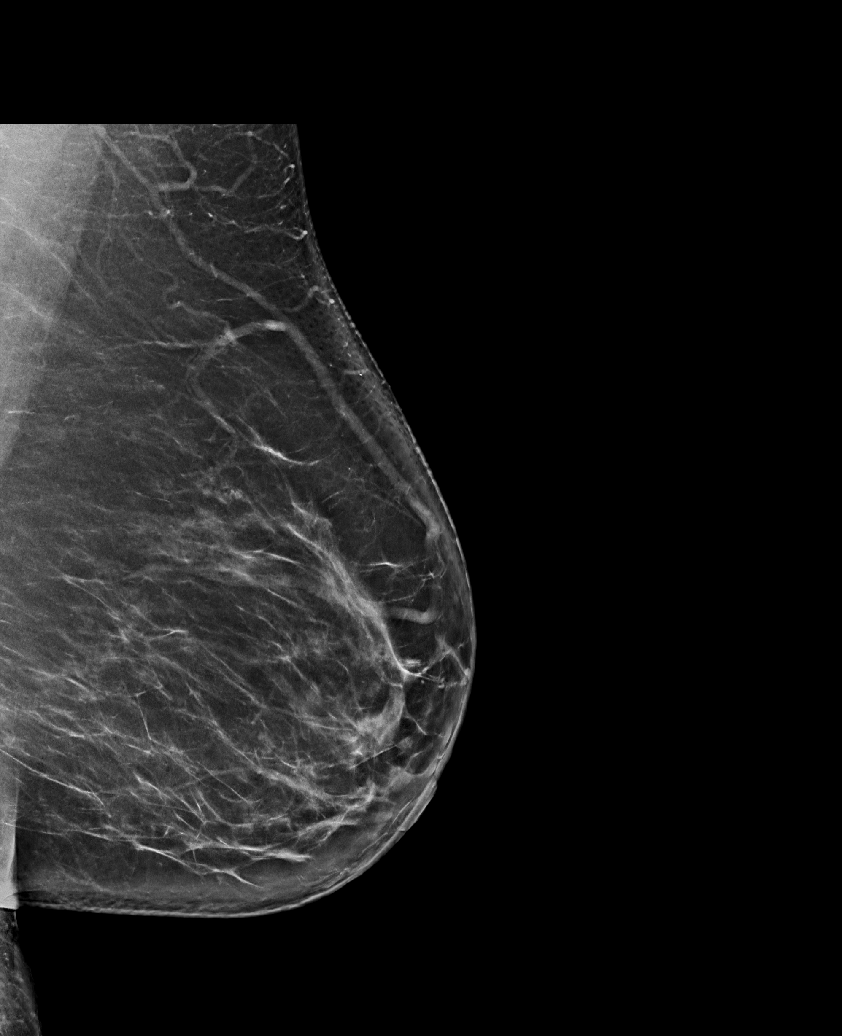

[R CC synth-2D]
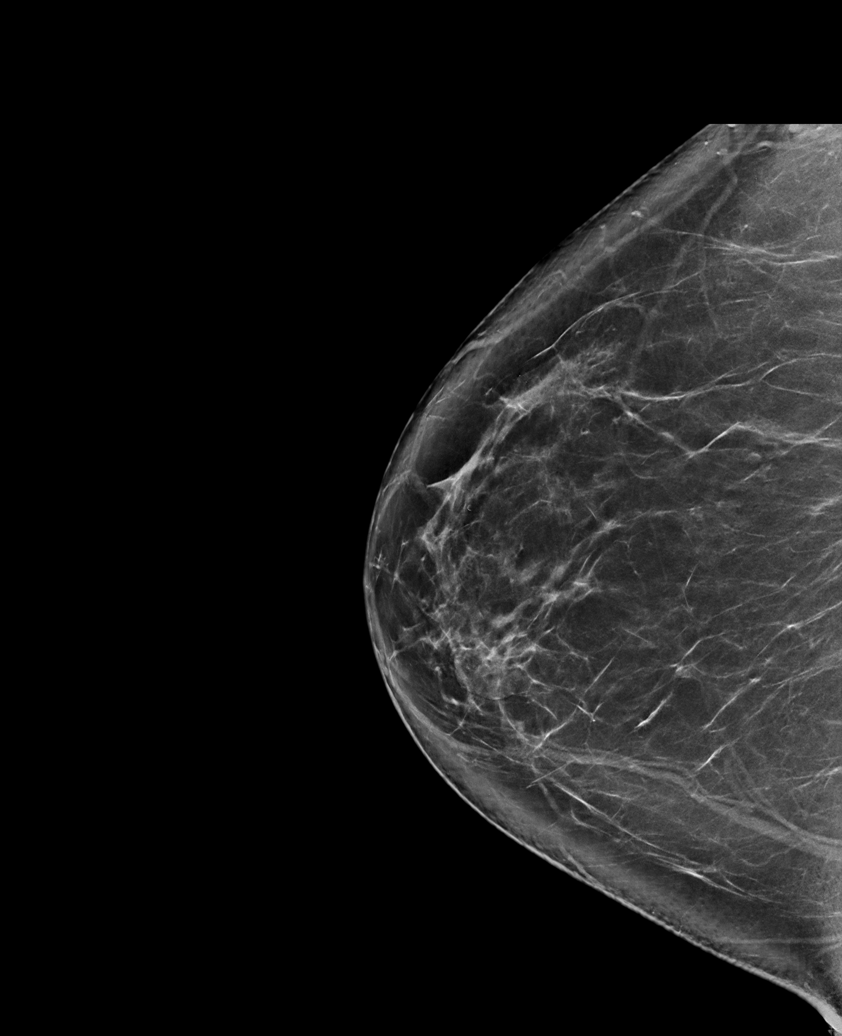

[R MLO synth-2D (2 of 2)]
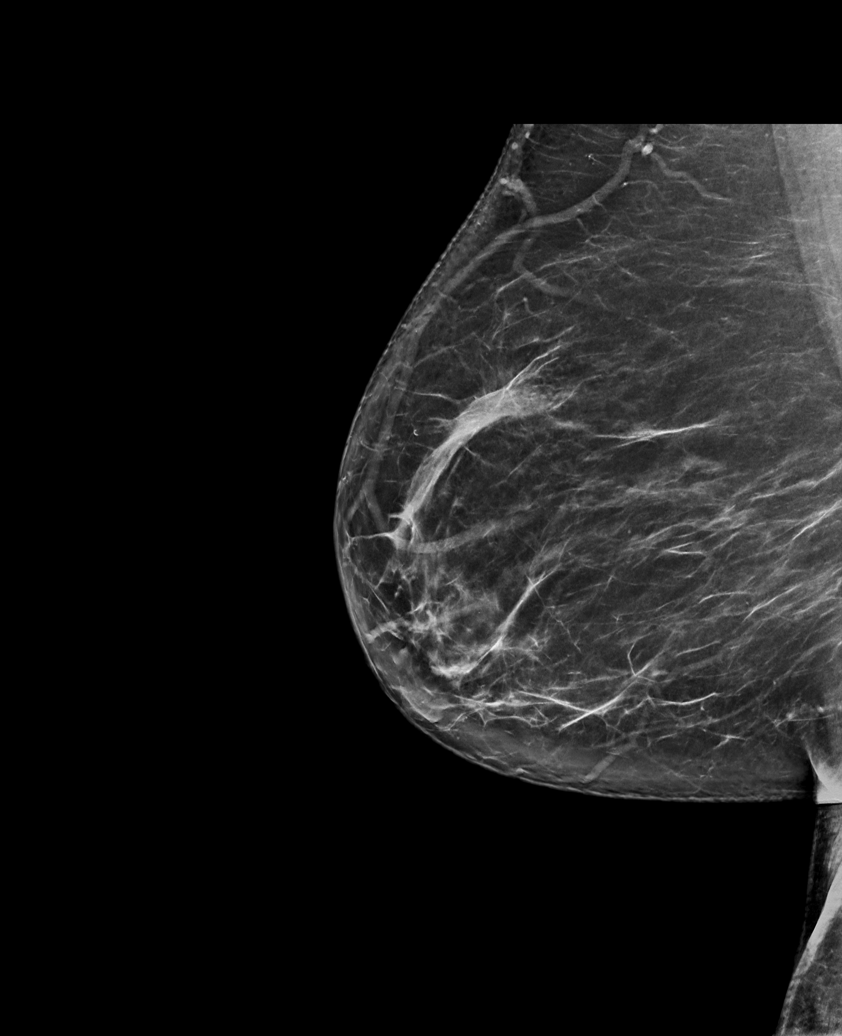

[6 of 36 positions shown; findings below may reference images not displayed]

ACR Breast Density Category b: There are scattered areas of
fibroglandular density.
FINDINGS: There are no findings suspicious for malignancy.
IMPRESSION: No mammographic evidence of malignancy. A result letter of this
screening mammogram will be mailed directly to the patient.

RECOMMENDATION:
Screening mammogram in one year. (Code:51-O-LD2)

BI-RADS CATEGORY  1: Negative.

## 2023-04-30 ENCOUNTER — Telehealth: Payer: Self-pay | Admitting: Family

## 2023-04-30 NOTE — Telephone Encounter (Signed)
Prescription Request  04/30/2023  Is this a "Controlled Substance" medicine? No  LOV: Visit date not found ( Pt scheduled appt for 07/18/23- going on cruise)  What is the name of the medication or equipment? losartan (COZAAR) 50 MG tablet requested 90 days again  Have you contacted your pharmacy to request a refill? Yes   Which pharmacy would you like this sent to?  [id number] [place], [place] - [address] [place] [address] Phone: [phone] Fax: [phone]     Patient notified that their request is being sent to the clinical staff for review and that they should receive a response within 2 business days.   Please advise at Mobile [phone] (mobile)

## 2023-05-01 MED ORDER — LOSARTAN POTASSIUM 50 MG PO TABS: 50.0000 mg | ORAL_TABLET | Freq: Every day | ORAL | 1 refills | Status: DC

## 2023-05-01 NOTE — Addendum Note (Signed)
Addended by: Judieth Keens on: 05/01/2023 08:05 AM   Modules accepted: Orders

## 2023-05-01 NOTE — Telephone Encounter (Signed)
Rx has been sent in. 

## 2023-05-14 ENCOUNTER — Other Ambulatory Visit: Payer: Self-pay | Admitting: Family

## 2023-05-14 ENCOUNTER — Ambulatory Visit
Admission: RE | Admit: 2023-05-14 | Discharge: 2023-05-14 | Disposition: A | Discharge status: Home / Self Care | Payer: BC Managed Care – PPO | Source: Ambulatory Visit | Attending: Family | Admitting: Family

## 2023-05-14 DIAGNOSIS — N6320 Unspecified lump in the left breast, unspecified quadrant: Secondary | ICD-10-CM

## 2023-05-14 DIAGNOSIS — N6324 Unspecified lump in the left breast, lower inner quadrant: Secondary | ICD-10-CM | POA: Diagnosis not present

## 2023-07-02 ENCOUNTER — Encounter (INDEPENDENT_AMBULATORY_CARE_PROVIDER_SITE_OTHER): Payer: Self-pay

## 2023-07-18 ENCOUNTER — Encounter: Payer: Self-pay | Admitting: Family

## 2023-07-18 ENCOUNTER — Ambulatory Visit: Payer: BC Managed Care – PPO | Admitting: Family

## 2023-07-18 VITALS — BP 114/72 | HR 73 | Resp 18 | Ht 68.0 in | Wt 266.4 lb

## 2023-07-18 DIAGNOSIS — Z6841 Body Mass Index (BMI) 40.0 and over, adult: Secondary | ICD-10-CM

## 2023-07-18 DIAGNOSIS — I10 Essential (primary) hypertension: Secondary | ICD-10-CM | POA: Diagnosis not present

## 2023-07-18 DIAGNOSIS — R7303 Prediabetes: Secondary | ICD-10-CM | POA: Diagnosis not present

## 2023-07-18 MED ORDER — LOSARTAN POTASSIUM 50 MG PO TABS: ORAL_TABLET | ORAL | 1 refills | Status: DC

## 2023-07-18 NOTE — Progress Notes (Signed)
Jodi Bailey is a 57 y.o. female with the following history as recorded in Epic[employer]:  Patient Active Problem List   Diagnosis Date Noted   Paroxysmal atrial fibrillation (HCC) 06/18/2019   Morbid obesity (HCC) 06/18/2019   Hypertension 02/15/2017   Headache 01/27/2017   Bilateral swelling of feet and ankles 01/02/2017   Routine general medical examination at a health care facility 10/15/2016   Otalgia of both ears 09/26/2016   Headache(784.0) 07/13/2014   Arrhythmia 07/13/2014   Dizziness 07/13/2014   Obesity (BMI 30-39.9) 03/16/2014   Depression 08/31/2013   LOW BACK PAIN SYNDROME 02/18/2011   SKIN CANCER, HX OF 02/18/2011   BACK PAIN 02/04/2011    Current Outpatient Medications  Medication Sig Dispense Refill   Multiple Vitamins-Minerals (MULTIVITAMIN PO) Take 1 tablet by mouth daily.     losartan (COZAAR) 50 MG tablet Take 1 tablet daily as directed; take a 2nd tablet as directed if blood pressure is above 150/90 180 tablet 1   No current facility-administered medications for this visit.    Allergies: Codeine, Diovan [valsartan], Lisinopril, Metformin and related, and Sulfa antibiotics  Past Medical History:  Diagnosis Date   Anxiety    Atrial fibrillation (HCC)    in her 30s   Cancer (HCC)    Chronic headaches    Depression    Hypertension    Palpitations     Past Surgical History:  Procedure Laterality Date   CESAREAN SECTION  1997   2004, 2008   tumor removed     upper right hip area (as a child)   WISDOM TOOTH EXTRACTION      Family History  Problem Relation Age of Onset   Crohn's disease Mother    Diverticulitis Mother    Hypertension Father    Diabetes Father    Heart disease Father    Depression Sister        situational   Colon cancer Maternal Uncle    Hyperlipidemia Maternal Grandmother    Hypertension Maternal Grandmother    Osteoporosis Maternal Grandmother    Stroke Maternal Grandfather    Colon cancer Paternal Grandmother    Heart  disease Paternal Grandfather    Heart attack Paternal Grandfather    Depression Son    Suicidality Son    Alcohol abuse Neg Hx    Stomach cancer Neg Hx    Esophageal cancer Neg Hx     Social History   Tobacco Use   Smoking status: Never   Smokeless tobacco: Never  Substance Use Topics   Alcohol use: Yes    Comment: Very RARELY    Subjective:   6 month follow up on chronic care needs; requesting refill on Losartan; has been having increased family stressors and noticed that her blood pressure has been up on occasion- she has opted to take a 2nd dose of her blood pressure pill when the blood pressure is elevated;     Objective:  Vitals:   07/18/23 1458  BP: 114/72  Pulse: 73  Resp: 18  SpO2: 98%  Weight: 266 lb 6.4 oz (120.8 kg)  Height: 5\' 8"  (1.727 m)    General: Well developed, well nourished, in no acute distress  Skin : Warm and dry.  Head: Normocephalic and atraumatic  Lungs: Respirations unlabored; clear to auscultation bilaterally without wheeze, rales, rhonchi  CVS exam: normal rate and regular rhythm.  Abdomen: Soft; nontender; nondistended; normoactive bowel sounds; no masses or hepatosplenomegaly  Musculoskeletal: No deformities; no active joint inflammation  Extremities: No edema, cyanosis, clubbing  Vessels: Symmetric bilaterally  Neurologic: Alert and oriented; speech intact; face symmetrical; moves all extremities well; CNII-XII intact without focal deficit   Assessment:  1. Pre-diabetes   2. Essential hypertension   3. BMI 40.0-44.9, adult [facility])     Plan:  Update labs today; to consider GLP-1 based on results; Stable; prescription updated; offered cardiology consult but patient defers at this time- feels it is related to personal stressors; Refer to [facility];   No follow-ups on file.  Orders Placed This Encounter  Procedures   CBC with Differential/Platelet   Comp Met (CMET)   Hemoglobin A1c   Amb Ref to Medical Weight  Management    Referral Priority:   Routine    Referral Type:   Consultation    Number of Visits Requested:   1    Requested Prescriptions   Signed Prescriptions Disp Refills   losartan (COZAAR) 50 MG tablet 180 tablet 1    Sig: Take 1 tablet daily as directed; take a 2nd tablet as directed if blood pressure is above 150/90

## 2023-07-19 LAB — COMPREHENSIVE METABOLIC PANEL
AG Ratio: 1.6 (calc) (ref 1.0–2.5)
ALT: 20 U/L (ref 6–29)
AST: 19 U/L (ref 10–35)
Albumin: 4.2 g/dL (ref 3.6–5.1)
Alkaline phosphatase (APISO): 72 U/L (ref 37–153)
BUN: 15 mg/dL (ref 7–25)
CO2: 27 mmol/L (ref 20–32)
Calcium: 9.5 mg/dL (ref 8.6–10.4)
Chloride: 102 mmol/L (ref 98–110)
Creat: 0.86 mg/dL (ref 0.50–1.03)
Globulin: 2.6 g/dL (ref 1.9–3.7)
Glucose, Bld: 88 mg/dL (ref 65–99)
Potassium: 4.5 mmol/L (ref 3.5–5.3)
Sodium: 138 mmol/L (ref 135–146)
Total Bilirubin: 0.8 mg/dL (ref 0.2–1.2)
Total Protein: 6.8 g/dL (ref 6.1–8.1)

## 2023-07-19 LAB — CBC WITH DIFFERENTIAL/PLATELET
Absolute Monocytes: 416 cells/uL (ref 200–950)
Basophils Absolute: 39 {cells}/uL (ref 0–200)
Basophils Relative: 0.5 %
Eosinophils Absolute: 231 {cells}/uL (ref 15–500)
Eosinophils Relative: 3 %
HCT: 42.5 % (ref 35.0–45.0)
Hemoglobin: 14.2 g/dL (ref 11.7–15.5)
Lymphs Abs: 2541 {cells}/uL (ref 850–3900)
MCH: 29.8 pg (ref 27.0–33.0)
MCHC: 33.4 g/dL (ref 32.0–36.0)
MCV: 89.3 fL (ref 80.0–100.0)
MPV: 10.2 fL (ref 7.5–12.5)
Monocytes Relative: 5.4 %
Neutro Abs: 4474 {cells}/uL (ref 1500–7800)
Neutrophils Relative %: 58.1 %
Platelets: 269 10*3/uL (ref 140–400)
RBC: 4.76 10*6/uL (ref 3.80–5.10)
RDW: 12.9 % (ref 11.0–15.0)
Total Lymphocyte: 33 %
WBC: 7.7 10*3/uL (ref 3.8–10.8)

## 2023-07-19 LAB — HEMOGLOBIN A1C
Hgb A1c MFr Bld: 5.9 %{Hb} — ABNORMAL HIGH (ref <5.7)
Mean Plasma Glucose: 123 mg/dL
eAG (mmol/L): 6.8 mmol/L

## 2023-07-21 ENCOUNTER — Encounter: Payer: Self-pay | Admitting: Family

## 2023-09-08 ENCOUNTER — Encounter: Payer: Self-pay | Admitting: Family

## 2023-09-09 ENCOUNTER — Other Ambulatory Visit: Payer: Self-pay | Admitting: Family

## 2023-09-09 DIAGNOSIS — N644 Mastodynia: Secondary | ICD-10-CM

## 2023-10-01 ENCOUNTER — Encounter: Payer: Self-pay | Admitting: Family

## 2023-10-02 ENCOUNTER — Other Ambulatory Visit: Payer: Self-pay | Admitting: Family

## 2023-10-02 MED ORDER — LOSARTAN POTASSIUM 50 MG PO TABS: 50.0000 mg | ORAL_TABLET | Freq: Two times a day (BID) | ORAL | 1 refills | Status: DC

## 2023-10-14 ENCOUNTER — Ambulatory Visit
Admission: RE | Admit: 2023-10-14 | Discharge: 2023-10-14 | Disposition: A | Discharge status: Home / Self Care | Payer: BC Managed Care – PPO | Source: Ambulatory Visit | Attending: Family | Admitting: Family

## 2023-10-14 DIAGNOSIS — N6324 Unspecified lump in the left breast, lower inner quadrant: Secondary | ICD-10-CM | POA: Diagnosis not present

## 2023-10-14 DIAGNOSIS — N6320 Unspecified lump in the left breast, unspecified quadrant: Secondary | ICD-10-CM

## 2023-10-14 DIAGNOSIS — N644 Mastodynia: Secondary | ICD-10-CM

## 2023-12-27 ENCOUNTER — Other Ambulatory Visit: Payer: Self-pay | Admitting: Family

## 2024-01-08 ENCOUNTER — Encounter (INDEPENDENT_AMBULATORY_CARE_PROVIDER_SITE_OTHER): Payer: Self-pay | Admitting: Physician Assistant

## 2024-01-08 ENCOUNTER — Ambulatory Visit (INDEPENDENT_AMBULATORY_CARE_PROVIDER_SITE_OTHER): Payer: BC Managed Care – PPO | Admitting: Physician Assistant

## 2024-01-08 VITALS — BP 125/78 | HR 89 | Temp 99.0°F | Ht 67.0 in | Wt 277.0 lb

## 2024-01-08 DIAGNOSIS — Z0289 Encounter for other administrative examinations: Secondary | ICD-10-CM

## 2024-01-08 DIAGNOSIS — Z6841 Body Mass Index (BMI) 40.0 and over, adult: Secondary | ICD-10-CM

## 2024-01-08 DIAGNOSIS — F439 Reaction to severe stress, unspecified: Secondary | ICD-10-CM | POA: Diagnosis not present

## 2024-01-08 DIAGNOSIS — R7303 Prediabetes: Secondary | ICD-10-CM

## 2024-01-08 DIAGNOSIS — R7309 Other abnormal glucose: Secondary | ICD-10-CM | POA: Diagnosis not present

## 2024-01-08 DIAGNOSIS — I10 Essential (primary) hypertension: Secondary | ICD-10-CM | POA: Diagnosis not present

## 2024-01-08 DIAGNOSIS — I480 Paroxysmal atrial fibrillation: Secondary | ICD-10-CM

## 2024-01-08 DIAGNOSIS — E66813 Obesity, class 3: Secondary | ICD-10-CM

## 2024-01-08 NOTE — Progress Notes (Signed)
 Office: [phone]  /  Fax: [phone]   Initial Visit  Jodi Bailey was seen in clinic today to evaluate for obesity. She is interested in losing weight to improve overall health and reduce the risk of weight related complications. She presents today to review program treatment options, initial physical assessment, and evaluation.     She was referred by: PCP  When asked what else they would like to accomplish? She states: Improve energy levels and physical activity, Improve existing medical conditions, Reduce number of medications, Improve quality of life, and Improve appearance  Weight history: Weight has been up and down over the years. Played sports in college and was told she was overweight at 165 lbs.  Pregnancies complicated by pre-eclampsia and very high risk pregnancies x 3. Peak weight at 325 lbs going thru very stressful divorce  Lost weight from peak and since Jan 2023 and helping care for mom who is 39 yr old /has dementia.  Very much a stress eater.    When asked how has your weight affected you? She states: Contributed to medical problems, Having fatigue, and Problems with eating patterns  Some associated conditions: Hypertension, Hyperlipidemia, MASLD, Prediabetes, PCOS, Tachyarrhythmia, Vitamin D  Deficiency, and Venous insufficiency  Contributing factors: Family history of obesity, Moderate to high levels of stress, Reduced physical activity, Eating patterns, Mental health problems, Menopause, and Slow metabolism for age  Weight promoting medications identified: None  Current nutrition plan: Low-carb and Portion control / smart choices  Current level of physical activity: Walking 30-60 minutes  Current or previous pharmacotherapy: Metformin   Response to medication:  SE and stopped metformin  due to GI upset   Past medical history includes:   Past Medical History:  Diagnosis Date   Anxiety    Atrial fibrillation (HCC)    in her 30s   Cancer (HCC)     Chronic headaches    Depression    Hypertension    Palpitations      Objective:   BP 125/78   Pulse 89   Temp 99 F (37.2 C)   Ht 5' 7 (1.702 m)   Wt 277 lb (125.6 kg)   LMP 02/03/2017   SpO2 94%   BMI 43.38 kg/m  She was weighed on the bioimpedance scale: Body mass index is 43.38 kg/m.  Peak Weight:325 lbs , Body Fat%:49.4%, Visceral Fat Rating:16, Weight trend over the last 12 months: Increasing  General:  Alert, oriented and cooperative. Patient is in no acute distress.  Respiratory: Normal respiratory effort, no problems with respiration noted   Gait: able to ambulate independently  Mental Status: Normal mood and affect. Normal behavior. Normal judgment and thought content.   DIAGNOSTIC DATA REVIEWED:  BMET    Component Value Date/Time   NA 138 07/18/2023 1534   K 4.5 07/18/2023 1534   CL 102 07/18/2023 1534   CO2 27 07/18/2023 1534   GLUCOSE 88 07/18/2023 1534   BUN 15 07/18/2023 1534   CREATININE 0.86 07/18/2023 1534   CALCIUM 9.5 07/18/2023 1534   GFRNONAA >60 02/04/2017 1441   GFRAA >60 02/04/2017 1441   Lab Results  Component Value Date   HGBA1C 5.9 (H) 07/18/2023   HGBA1C 5.5 07/13/2014   No results found for: INSULIN  CBC    Component Value Date/Time   WBC 7.7 07/18/2023 1534   RBC 4.76 07/18/2023 1534   HGB 14.2 07/18/2023 1534   HCT 42.5 07/18/2023 1534   PLT 269 07/18/2023 1534   MCV 89.3 07/18/2023 1534  MCH 29.8 07/18/2023 1534   MCHC 33.4 07/18/2023 1534   RDW 12.9 07/18/2023 1534   Iron/TIBC/Ferritin/ %Sat No results found for: IRON, TIBC, FERRITIN, IRONPCTSAT Lipid Panel     Component Value Date/Time   CHOL 218 (H) 04/03/2020 1058   TRIG 162.0 (H) 04/03/2020 1058   HDL 47.00 04/03/2020 1058   CHOLHDL 5 04/03/2020 1058   VLDL 32.4 04/03/2020 1058   LDLCALC 139 (H) 04/03/2020 1058   Hepatic Function Panel     Component Value Date/Time   PROT 6.8 07/18/2023 1534   ALBUMIN 4.1 08/23/2022 1037   AST 19  07/18/2023 1534   ALT 20 07/18/2023 1534   ALKPHOS 95 08/23/2022 1037   BILITOT 0.8 07/18/2023 1534      Component Value Date/Time   TSH 0.79 06/07/2019 1439     Assessment and Plan:   Pre-diabetes  Essential hypertension  Elevated glucose  Situational stress  Paroxysmal atrial fibrillation (HCC)  Class 3 severe obesity due to excess calories with serious comorbidity and body mass index (BMI) of 40.0 to 44.9 in adult [facility])        Obesity Treatment / Action Plan:  Patient will work on garnering support from family and friends to begin weight loss journey. Will work on eliminating or reducing the presence of highly palatable, calorie dense foods in the home. Will complete provided nutritional and psychosocial assessment questionnaire before the next appointment. Will be scheduled for indirect calorimetry to determine resting energy expenditure in a fasting state.  This will allow us  to create a reduced calorie, high-protein meal plan to promote loss of fat mass while preserving muscle mass. Will think about ideas on how to incorporate physical activity into their daily routine. Will avoid skipping meals which may result in increased hunger signals and overeating at certain times. Will work on managing stress via relaxation methods as this may result in unhealthy eating patterns. Counseled on the health benefits of losing 5%-15% of total body weight. Was counseled on nutritional approaches to weight loss and benefits of reducing processed foods and consuming plant-based foods and high quality protein as part of nutritional weight management. Was counseled on pharmacotherapy and role as an adjunct in weight management.   Obesity Education Performed Today:  She was weighed on the bioimpedance scale and results were discussed and documented in the synopsis.  We discussed obesity as a disease and the importance of a more detailed evaluation of all the factors contributing to  the disease.  We discussed the importance of long term lifestyle changes which include nutrition, exercise and behavioral modifications as well as the importance of customizing this to her specific health and social needs.  We discussed the benefits of reaching a healthier weight to alleviate the symptoms of existing conditions and reduce the risks of the biomechanical, metabolic and psychological effects of obesity.  Jodi Bailey appears to be in the action stage of change and states they are ready to start intensive lifestyle modifications and behavioral modifications.  30 minutes was spent today on this visit including the above counseling, pre-visit chart review, and post-visit documentation.  Reviewed by clinician on day of visit: allergies, medications, problem list, medical history, surgical history, family history, social history, and previous encounter notes pertinent to obesity diagnosis.   Kimon Loewen,PA-C

## 2024-02-04 ENCOUNTER — Encounter (INDEPENDENT_AMBULATORY_CARE_PROVIDER_SITE_OTHER): Payer: Self-pay | Admitting: Family Medicine

## 2024-02-04 ENCOUNTER — Ambulatory Visit (INDEPENDENT_AMBULATORY_CARE_PROVIDER_SITE_OTHER): Payer: BC Managed Care – PPO | Admitting: Family Medicine

## 2024-02-04 VITALS — BP 112/74 | HR 74 | Temp 98.3°F | Ht 67.0 in | Wt 276.0 lb

## 2024-02-04 DIAGNOSIS — I10 Essential (primary) hypertension: Secondary | ICD-10-CM | POA: Diagnosis not present

## 2024-02-04 DIAGNOSIS — E559 Vitamin D deficiency, unspecified: Secondary | ICD-10-CM | POA: Diagnosis not present

## 2024-02-04 DIAGNOSIS — Z1331 Encounter for screening for depression: Secondary | ICD-10-CM | POA: Diagnosis not present

## 2024-02-04 DIAGNOSIS — R7303 Prediabetes: Secondary | ICD-10-CM

## 2024-02-04 DIAGNOSIS — Z8632 Personal history of gestational diabetes: Secondary | ICD-10-CM | POA: Insufficient documentation

## 2024-02-04 DIAGNOSIS — R5383 Other fatigue: Secondary | ICD-10-CM

## 2024-02-04 DIAGNOSIS — R0602 Shortness of breath: Secondary | ICD-10-CM

## 2024-02-04 DIAGNOSIS — E782 Mixed hyperlipidemia: Secondary | ICD-10-CM

## 2024-02-04 DIAGNOSIS — Z8759 Personal history of other complications of pregnancy, childbirth and the puerperium: Secondary | ICD-10-CM

## 2024-02-04 DIAGNOSIS — Z6841 Body Mass Index (BMI) 40.0 and over, adult: Secondary | ICD-10-CM

## 2024-02-04 DIAGNOSIS — E785 Hyperlipidemia, unspecified: Secondary | ICD-10-CM | POA: Insufficient documentation

## 2024-02-04 NOTE — Assessment & Plan Note (Signed)
 In 1st and 3rd pregnancy.  Both at term and postpartum.  She gained 120lbs with first pregnancy due to fluid retention.  Had eclampsia with first pregnancy.  Needed magnesium x 2.

## 2024-02-04 NOTE — Assessment & Plan Note (Signed)
 Elevated lipid panel in the past- not on medication.  Will repeat FLP today.

## 2024-02-04 NOTE — Assessment & Plan Note (Signed)
 Diagnosed about 3-4 years ago and not on medication.  Last A1c 5.7.  Repeat A1c and Insulin level today.

## 2024-02-04 NOTE — Assessment & Plan Note (Signed)
 Only with 1st pregnancy.  Has been between A1c of 5.7-6.0 in last few years.

## 2024-02-04 NOTE — Assessment & Plan Note (Signed)
 Patient hasn't been tested in a while but was previously at 12-16 in terms of her level.

## 2024-02-04 NOTE — Progress Notes (Signed)
 Chief Complaint:  Obesity   Subjective:  Jodi Bailey (MR# [id number]) is a 58 y.o. female who presents for evaluation and treatment of obesity and related comorbidities.   Jodi Bailey is currently in the action stage of change and ready to dedicate time achieving and maintaining a healthier weight. Jodi Bailey is interested in becoming our patient and working on intensive lifestyle modifications including (but not limited to) diet and exercise for weight loss.  Jodi Bailey has been struggling with her weight. She has been unsuccessful in either losing weight, maintaining weight loss, or reaching her healthy weight goal.  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  Lives with her 19 year old son Sam 50/50.  Family is supportive, occasionally eats meals with her son when he is home.  Desired weight is 160-180lbs and last time she was that weight was 25 years ago.  Previously did [place] and lost 60lbs in September of 2022.  Stayed on this until June of 2023.   Maintained this for a bit.  Food Recall: 6-8oz ice water in the am.  Breakfast in am decaf coffee between 1/3 to 1/2 cup frozen blueberries, 3/4 cup chobani greek yogurt with sprinkled cinnamon with strawberries.  Feels satisfied.  May have a banana or mandarin orange or apple as a snack.  Lunch is pre made salad mix with tomatoes, strawberries, cucumbers, 2 tbsp cheese, 1/2 cup cottage cheese, pepper, salad dressing.  Feels satisfied. Dinner is chicken (grilled)- 6oz, saute spinach, vegetables, cauliflower rice.  May have triscuit crackers if craving.  May have sugar free jello or sugar free pudding.   Indirect Calorimeter completed today shows a  1800. Her calculated basal metabolic rate is 1027 thus her basal metabolic rate is worse than expected.  Other Fatigue Jodi Bailey denies daytime somnolence and denies waking up  still tired. Patient has a history of symptoms of n/a. Jodi Bailey generally gets 7 or 8 hours of sleep per night, and states that she has generally restful sleep. Snoring is present. Apneic episodes are not present. Epworth Sleepiness Score is 7.   Shortness of Breath Jodi Bailey notes increasing shortness of breath with exercising and seems to be worsening over time with weight gain. She notes getting out of breath sooner with activity than she used to. This has not gotten worse recently. Antania denies shortness of breath at rest or orthopnea.  Depression Screen Jodi Bailey's Food and Mood (modified PHQ-9) score was 2.     02/04/2024    8:34 AM  Depression screen PHQ 2/9  Decreased Interest 0  Down, Depressed, Hopeless 0  PHQ - 2 Score 0  Altered sleeping 0  Tired, decreased energy 1  Change in appetite 1  Feeling bad or failure about yourself  0  Trouble concentrating 0  Moving slowly or fidgety/restless 0  Suicidal thoughts 0  PHQ-9 Score 2  Difficult doing work/chores Not difficult at all     Objective:  Vitals Temp: 98.3 F (36.8 C) BP: 112/74 Pulse Rate: 74 SpO2: 92 %   Anthropometric Measurements Height: 5\' 7"  (1.702 m) Weight: 276 lb (125.2 kg) BMI (Calculated): 43.22 Starting Weight: 276 lb Peak Weight: 325 lb Waist Measurement : 49 inches   Body Composition  Body Fat %: 50 % Fat Mass (lbs): 138.2 lbs Muscle Mass (lbs): 131 lbs Total Body Water (lbs): 108.8 lbs Visceral Fat Rating :  17   Other Clinical Data RMR: 1800 Fasting: yes Labs: yes Today's Visit #: 1 Starting Date: 02/04/24    EKG: Normal sinus rhythm, rate 65 bpm.  General: Cooperative, alert, well developed, in no acute distress. HEENT: Conjunctivae and lids unremarkable. Cardiovascular: Regular rhythm.  Lungs: Normal work of breathing. Neurologic: No focal deficits.   Lab Results  Component Value Date   CREATININE 0.86 07/18/2023   BUN 15 07/18/2023   NA 138 07/18/2023   K 4.5  07/18/2023   CL 102 07/18/2023   CO2 27 07/18/2023   Lab Results  Component Value Date   ALT 20 07/18/2023   AST 19 07/18/2023   ALKPHOS 95 08/23/2022   BILITOT 0.8 07/18/2023   Lab Results  Component Value Date   HGBA1C 5.9 (H) 07/18/2023   HGBA1C 6.0 08/23/2022   HGBA1C 5.7 11/08/2020   HGBA1C 5.8 04/03/2020   HGBA1C 5.9 06/07/2019   No results found for: "INSULIN" Lab Results  Component Value Date   TSH 0.79 06/07/2019   Lab Results  Component Value Date   CHOL 218 (H) 04/03/2020   HDL 47.00 04/03/2020   LDLCALC 139 (H) 04/03/2020   TRIG 162.0 (H) 04/03/2020   CHOLHDL 5 04/03/2020   Lab Results  Component Value Date   WBC 7.7 07/18/2023   HGB 14.2 07/18/2023   HCT 42.5 07/18/2023   MCV 89.3 07/18/2023   PLT 269 07/18/2023   No results found for: "IRON", "TIBC", "FERRITIN"  Assessment and Plan:   Other Fatigue  Jodi Bailey does feel that her weight is causing her energy to be lower than it should be. Fatigue may be related to obesity, depression or many other causes. Labs will be ordered, and in the meanwhile, Jodi Bailey will focus on self care including making healthy food choices, increasing physical activity and focusing on stress reduction.  Shortness of Breath  Jodi Bailey does feel that she gets out of breath more easily that she used to when she exercises. 's shortness of breath appears to be obesity related and exercise induced. She has agreed to work on weight loss and gradually increase exercise to treat her exercise induced shortness of breath. Will continue to monitor closely.   Problem List Items Addressed This Visit       Cardiovascular and Mediastinum   Hypertension   Diagnosed about 5 years ago- was on atenolol previously.  She is on 100mg  losartan and has fluctuated between 50 and 100mg .  CMP today.        Other   Morbid obesity (HCC)   Prediabetes   Diagnosed about 3-4 years ago and not on medication.  Last A1c 5.7.  Repeat A1c and Insulin  level today.      Vitamin D deficiency   Patient hasn't been tested in a while but was previously at 12-16 in terms of her level.      HLD (hyperlipidemia)   Elevated lipid panel in the past- not on medication.  Will repeat FLP today.      History of pre-eclampsia   In 1st and 3rd pregnancy.  Both at term and postpartum.  She gained 120lbs with first pregnancy due to fluid retention.  Had eclampsia with first pregnancy.  Needed magnesium x 2.      History of gestational diabetes   Only with 1st pregnancy.  Has been between A1c of 5.7-6.0 in last few years.      Other Visit Diagnoses       Other fatigue    -  Primary   Relevant Orders   EKG 12-Lead (Completed)     BMI 40.0-44.9, adult (HCC)           Jodi Bailey is currently in the action stage of change and her goal is to continue with weight loss efforts. I recommend Jodi Bailey begin the structured treatment plan as follows:  She has agreed to Category 3 Plan  Exercise goals: No exercise has been prescribed at this time.  Behavioral modification strategies:increasing lean protein intake, increasing vegetables, meal planning and cooking strategies, and planning for success  She was informed of the importance of frequent follow-up visits to maximize her success with intensive lifestyle modifications for her multiple health conditions. She was informed we would discuss her lab results at her next visit unless there is a critical issue that needs to be addressed sooner. Jodi Bailey agreed to keep her next visit at the agreed upon time to discuss these results.  Labs ordered with plans to discuss at the next visit.   Attestation Statements:  Reviewed by clinician on day of visit: allergies, medications, problem list, medical history, surgical history, family history, social history, and previous encounter notes. This is the patient's first visit at [facility]. The patient's NEW PATIENT PACKET was reviewed at length.  Included in the packet: current and past health history, medications, allergies, ROS, gynecologic history (women only), surgical history, family history, social history, weight history, weight loss surgery history (for those that have had weight loss surgery), nutritional evaluation, mood and food questionnaire, PHQ9, Epworth questionnaire, sleep habits questionnaire, patient life and health improvement goals questionnaire. These will all be scanned into the patient's chart under media.   During the visit, I independently reviewed the patient's EKG, bioimpedance scale results, and indirect calorimeter results. I used this information to tailor a meal plan for the patient that will help her to lose weight and will improve her obesity-related conditions going forward. I performed a medically necessary appropriate examination and/or evaluation. I discussed the assessment and treatment plan with the patient. The patient was provided an opportunity to ask questions and all were answered. The patient agreed with the plan and demonstrated an understanding of the instructions. Labs were ordered at this visit and will be reviewed at the next visit unless more critical results need to be addressed immediately. Clinical information was updated and documented in the EMR.   Time spent on visit including pre-visit chart review and post-visit charting and care was 45 minutes.   Reuben Likes, MD

## 2024-02-04 NOTE — Assessment & Plan Note (Signed)
 Diagnosed about 5 years ago- was on atenolol previously.  She is on 100mg  losartan and has fluctuated between 50 and 100mg .  CMP today.

## 2024-02-05 LAB — CBC WITH DIFFERENTIAL/PLATELET
Basophils Absolute: 0 10*3/uL (ref 0.0–0.2)
Basos: 1 %
EOS (ABSOLUTE): 0.2 10*3/uL (ref 0.0–0.4)
Eos: 3 %
Hematocrit: 46.5 % (ref 34.0–46.6)
Hemoglobin: 15.1 g/dL (ref 11.1–15.9)
Immature Grans (Abs): 0.1 10*3/uL (ref 0.0–0.1)
Immature Granulocytes: 1 %
Lymphocytes Absolute: 2.3 10*3/uL (ref 0.7–3.1)
Lymphs: 37 %
MCH: 29.7 pg (ref 26.6–33.0)
MCHC: 32.5 g/dL (ref 31.5–35.7)
MCV: 92 fL (ref 79–97)
Monocytes Absolute: 0.4 10*3/uL (ref 0.1–0.9)
Monocytes: 6 %
Neutrophils Absolute: 3.2 10*3/uL (ref 1.4–7.0)
Neutrophils: 52 %
Platelets: 278 10*3/uL (ref 150–450)
RBC: 5.08 x10E6/uL (ref 3.77–5.28)
RDW: 13 % (ref 11.7–15.4)
WBC: 6.2 10*3/uL (ref 3.4–10.8)

## 2024-02-05 LAB — INSULIN, RANDOM: INSULIN: 31.2 u[IU]/mL — ABNORMAL HIGH (ref 2.6–24.9)

## 2024-02-05 LAB — COMPREHENSIVE METABOLIC PANEL
ALT: 26 IU/L (ref 0–32)
AST: 24 IU/L (ref 0–40)
Albumin: 4.3 g/dL (ref 3.8–4.9)
Alkaline Phosphatase: 88 IU/L (ref 44–121)
BUN/Creatinine Ratio: 23 (ref 9–23)
BUN: 21 mg/dL (ref 6–24)
Bilirubin Total: 1 mg/dL (ref 0.0–1.2)
CO2: 21 mmol/L (ref 20–29)
Calcium: 9.4 mg/dL (ref 8.7–10.2)
Chloride: 103 mmol/L (ref 96–106)
Creatinine, Ser: 0.91 mg/dL (ref 0.57–1.00)
Globulin, Total: 2.8 g/dL (ref 1.5–4.5)
Glucose: 94 mg/dL (ref 70–99)
Potassium: 4.8 mmol/L (ref 3.5–5.2)
Sodium: 140 mmol/L (ref 134–144)
Total Protein: 7.1 g/dL (ref 6.0–8.5)
eGFR: 74 mL/min/{1.73_m2} (ref >59)

## 2024-02-05 LAB — VITAMIN B12: Vitamin B-12: 296 pg/mL (ref 232–1245)

## 2024-02-05 LAB — THYROID PANEL WITH TSH
Free Thyroxine Index: 2.3 (ref 1.2–4.9)
T3 Uptake Ratio: 25 % (ref 24–39)
T4, Total: 9.2 ug/dL (ref 4.5–12.0)
TSH: 1.28 u[IU]/mL (ref 0.450–4.500)

## 2024-02-05 LAB — LIPID PANEL WITH LDL/HDL RATIO
Cholesterol, Total: 191 mg/dL (ref 100–199)
HDL: 41 mg/dL (ref >39)
LDL Chol Calc (NIH): 125 mg/dL — ABNORMAL HIGH (ref 0–99)
LDL/HDL Ratio: 3 ratio (ref 0.0–3.2)
Triglycerides: 139 mg/dL (ref 0–149)
VLDL Cholesterol Cal: 25 mg/dL (ref 5–40)

## 2024-02-05 LAB — HEMOGLOBIN A1C
Est. average glucose Bld gHb Est-mCnc: 128 mg/dL
Hgb A1c MFr Bld: 6.1 % — ABNORMAL HIGH (ref 4.8–5.6)

## 2024-02-05 LAB — VITAMIN D 25 HYDROXY (VIT D DEFICIENCY, FRACTURES): Vit D, 25-Hydroxy: 21.4 ng/mL — ABNORMAL LOW (ref 30.0–100.0)

## 2024-02-05 LAB — FOLATE: Folate: 12.2 ng/mL (ref >3.0)

## 2024-02-06 ENCOUNTER — Encounter (INDEPENDENT_AMBULATORY_CARE_PROVIDER_SITE_OTHER): Payer: Self-pay | Admitting: Family Medicine

## 2024-02-18 ENCOUNTER — Encounter (INDEPENDENT_AMBULATORY_CARE_PROVIDER_SITE_OTHER): Payer: Self-pay | Admitting: Family Medicine

## 2024-02-18 ENCOUNTER — Ambulatory Visit (INDEPENDENT_AMBULATORY_CARE_PROVIDER_SITE_OTHER): Payer: BC Managed Care – PPO | Admitting: Family Medicine

## 2024-02-18 VITALS — BP 117/78 | HR 75 | Temp 98.3°F | Ht 67.0 in | Wt 273.0 lb

## 2024-02-18 DIAGNOSIS — E559 Vitamin D deficiency, unspecified: Secondary | ICD-10-CM | POA: Diagnosis not present

## 2024-02-18 DIAGNOSIS — I10 Essential (primary) hypertension: Secondary | ICD-10-CM

## 2024-02-18 DIAGNOSIS — E785 Hyperlipidemia, unspecified: Secondary | ICD-10-CM

## 2024-02-18 DIAGNOSIS — Z6841 Body Mass Index (BMI) 40.0 and over, adult: Secondary | ICD-10-CM

## 2024-02-18 DIAGNOSIS — R7303 Prediabetes: Secondary | ICD-10-CM

## 2024-02-18 DIAGNOSIS — E782 Mixed hyperlipidemia: Secondary | ICD-10-CM

## 2024-02-18 MED ORDER — VITAMIN D (ERGOCALCIFEROL) 1.25 MG (50000 UNIT) PO CAPS: 50000.0000 [IU] | ORAL_CAPSULE | ORAL | 0 refills | Status: DC

## 2024-02-18 NOTE — Assessment & Plan Note (Addendum)
 The 10-year ASCVD risk score (Arnett DK, et al., 2019) is: 3.1%   Values used to calculate the score:     Age: 57 years     Sex: Female     Is Non-Hispanic African American: No     Diabetic: No     Tobacco smoker: No     Systolic Blood Pressure: 117 mmHg     Is BP treated: Yes     HDL Cholesterol: 41 mg/dL     Total Cholesterol: 191 mg/dL  Discussed lifestyle changes and dietary macronutrient intake changes to limited saturated fat.  Will repeat labs in 3-4 months.

## 2024-02-18 NOTE — Assessment & Plan Note (Signed)

## 2024-02-18 NOTE — Assessment & Plan Note (Signed)
 Discussed importance of vitamin d supplementation.  Vitamin d supplementation has been shown to decrease fatigue, decrease risk of progression to insulin resistance and then prediabetes, decreases risk of falling in older age and can even assist in decreasing depressive symptoms in PTSD.   Prescription for Vitamin D sent in.

## 2024-02-18 NOTE — Assessment & Plan Note (Signed)
 Blood pressure well controlled today.  No chest pain, chest pressure or headache.  On losartan 50mg  daily.  No change in med or dosage.

## 2024-02-18 NOTE — Progress Notes (Signed)
 SUBJECTIVE:  Chief Complaint: Obesity  Interim History: patient enjoyed her first two weeks. She was really surprised about how much food it was to eat.  She doesn't always have a snack but she is always carrying fruit.  She wasn't able to get all the food in.  Her birthday is coming up and she is planning to go out to dinner with her boyfriend.  She is wondering about where to go out that will stay within perimeters.  Her son's birthday is tomorrow and her birthday is in 2 days.  She did eat emotionally yesterday. Additionally she has been logging her food on my Fitness pal.   Kylii is here to discuss her progress with her obesity treatment plan. She is on the Category 3 Plan and states she is following her eating plan approximately 75 % of the time. She states she is walking 60 minutes 3-4 times per week.   OBJECTIVE: Visit Diagnoses: Problem List Items Addressed This Visit       Cardiovascular and Mediastinum   Hypertension   Blood pressure well controlled today.  No chest pain, chest pressure or headache.  On losartan 50mg  daily.  No change in med or dosage.        Other   Morbid obesity (HCC)   Prediabetes - Primary   Pathophysiology of progression through insulin resistance to prediabetes and diabetes was discussed at length today.  Patient to continue to monitor and be in control of total intake of snack calories which may be simple carbohydrates but should be consumed only after the patient has taken in all the nutrition for the day.  Macronutrient identification, classification and daily intake ratios were discussed.  Plan to repeat labs in 3 months to monitor both hemoglobin A1c and insulin levels.  No medications at this time as patient is not having significant hunger or cravings that would make following meal plan more difficult.         Vitamin D deficiency   Discussed importance of vitamin d supplementation.  Vitamin d supplementation has been shown to decrease  fatigue, decrease risk of progression to insulin resistance and then prediabetes, decreases risk of falling in older age and can even assist in decreasing depressive symptoms in PTSD.   Prescription for Vitamin D sent in.        Relevant Medications   Vitamin D, Ergocalciferol, (DRISDOL) 1.25 MG (50000 UNIT) CAPS capsule   HLD (hyperlipidemia)   The 10-year ASCVD risk score (Arnett DK, et al., 2019) is: 3.1%   Values used to calculate the score:     Age: 58 years     Sex: Female     Is Non-Hispanic African American: No     Diabetic: No     Tobacco smoker: No     Systolic Blood Pressure: 117 mmHg     Is BP treated: Yes     HDL Cholesterol: 41 mg/dL     Total Cholesterol: 191 mg/dL  Discussed lifestyle changes and dietary macronutrient intake changes to limited saturated fat.  Will repeat labs in 3-4 months.      Other Visit Diagnoses       Essential hypertension         BMI 40.0-44.9, adult (HCC)           Vitals Temp: 98.3 F (36.8 C) BP: 117/78 Pulse Rate: 75 SpO2: 97 %   Anthropometric Measurements Height: 5\' 7"  (1.702 m) Weight: 273 lb (123.8 kg) BMI (Calculated): 42.75 Weight  at Last Visit: 276 lb Weight Lost Since Last Visit: 3 Weight Gained Since Last Visit: 0 Starting Weight: 276 lb Total Weight Loss (lbs): 3 lb (1.361 kg) Peak Weight: 325 lb   Body Composition  Body Fat %: 49.3 % Fat Mass (lbs): 134.8 lbs Muscle Mass (lbs): 131.8 lbs Visceral Fat Rating : 16   Other Clinical Data Today's Visit #: 2 Starting Date: 02/04/24 Comments: Cat 3     ASSESSMENT AND PLAN:  Diet: Tahjanae is currently in the action stage of change. As such, her goal is to continue with weight loss efforts and has agreed to the Category 3 Plan.   Exercise:  All adults should avoid inactivity. Some activity is better than none, and adults who participate in any amount of physical activity, gain some health benefits.  Behavior Modification:  We discussed the  following Behavioral Modification Strategies today: increasing lean protein intake, increasing vegetables, meal planning and cooking strategies, avoiding temptations, and planning for success.  No follow-ups on file.Marland Kitchen She was informed of the importance of frequent follow up visits to maximize her success with intensive lifestyle modifications for her multiple health conditions.  Attestation Statements:   Reviewed by clinician on day of visit: allergies, medications, problem list, medical history, surgical history, family history, social history, and previous encounter notes.   Time spent on visit including pre-visit chart review and post-visit care and charting was 45 minutes  Reuben Likes, MD

## 2024-03-04 ENCOUNTER — Ambulatory Visit (INDEPENDENT_AMBULATORY_CARE_PROVIDER_SITE_OTHER): Admitting: Family Medicine

## 2024-03-04 VITALS — BP 118/78 | HR 75 | Temp 98.3°F | Ht 67.0 in | Wt 271.0 lb

## 2024-03-04 DIAGNOSIS — E785 Hyperlipidemia, unspecified: Secondary | ICD-10-CM

## 2024-03-04 DIAGNOSIS — Z6841 Body Mass Index (BMI) 40.0 and over, adult: Secondary | ICD-10-CM | POA: Diagnosis not present

## 2024-03-04 DIAGNOSIS — R7303 Prediabetes: Secondary | ICD-10-CM | POA: Diagnosis not present

## 2024-03-04 DIAGNOSIS — E782 Mixed hyperlipidemia: Secondary | ICD-10-CM

## 2024-03-04 NOTE — Assessment & Plan Note (Signed)
 No hunger or cravings in the last few weeks except when not getting all her nutrition in.  Will continue with Category 3 meal plan over the next few weeks and be in control of total snack calorie allotment.

## 2024-03-04 NOTE — Assessment & Plan Note (Signed)
 Last LDL elevated.  She has been mindful of food choices and is being more mindful of her fat intake daily.

## 2024-03-04 NOTE — Progress Notes (Signed)
   SUBJECTIVE:  Chief Complaint: Obesity  Interim History: Patient celebrated her birthday and went to [place] with her family.  She moved her mother and sister back into her mother's house.  She has been off Thursday thru Monday.  She wasn't as compliant as she had longer days and ate out more frequently.  She is planning to get back on plan this next few weeks. Next few weeks are busy with work and she is hoping to a take a bit of time off around [place] for personal time. She is excited to have nicer weather in the next few weeks.   Jodi Bailey is here to discuss her progress with her obesity treatment plan. She is on the Category 3 Plan and states she is following her eating plan approximately 50 % of the time. She states she is not exercising, but is moving Mom into home.   OBJECTIVE: Visit Diagnoses: Problem List Items Addressed This Visit       Other   Morbid obesity (HCC)   Prediabetes - Primary   No hunger or cravings in the last few weeks except when not getting all her nutrition in.  Will continue with Category 3 meal plan over the next few weeks and be in control of total snack calorie allotment.      HLD (hyperlipidemia)   Last LDL elevated.  She has been mindful of food choices and is being more mindful of her fat intake daily.      Other Visit Diagnoses       BMI 40.0-44.9, adult (HCC)           Vitals Temp: 98.3 F (36.8 C) BP: 118/78 Pulse Rate: 75 SpO2: 96 %   Anthropometric Measurements Height: 5\' 7"  (1.702 m) Weight: 271 lb (122.9 kg) BMI (Calculated): 42.43 Weight at Last Visit: 273 lb Weight Lost Since Last Visit: 2 Weight Gained Since Last Visit: 0 Starting Weight: 276 lb Total Weight Loss (lbs): 5 lb (2.268 kg) Peak Weight: 325 lb   Body Composition  Body Fat %: 47.4 % Fat Mass (lbs): 128.8 lbs Muscle Mass (lbs): 135.6 lbs Visceral Fat Rating : 16   Other Clinical Data Today's Visit #: 3 Starting Date: 02/04/24 Comments: Cat  3     ASSESSMENT AND PLAN:  Diet: Jodi Bailey is currently in the action stage of change. As such, her goal is to continue with weight loss efforts and has agreed to the Category 3 Plan.   Exercise:  All adults should avoid inactivity. Some activity is better than none, and adults who participate in any amount of physical activity, gain some health benefits.  Behavior Modification:  We discussed the following Behavioral Modification Strategies today: increasing lean protein intake, decreasing simple carbohydrates, meal planning and cooking strategies, avoiding temptations, and planning for success.   Return in about 2 weeks (around 03/18/2024).Marland Kitchen She was informed of the importance of frequent follow up visits to maximize her success with intensive lifestyle modifications for her multiple health conditions.  Attestation Statements:   Reviewed by clinician on day of visit: allergies, medications, problem list, medical history, surgical history, family history, social history, and previous encounter notes.    Reuben Likes, MD

## 2024-03-16 ENCOUNTER — Encounter (INDEPENDENT_AMBULATORY_CARE_PROVIDER_SITE_OTHER): Payer: Self-pay | Admitting: Family Medicine

## 2024-03-16 ENCOUNTER — Ambulatory Visit (INDEPENDENT_AMBULATORY_CARE_PROVIDER_SITE_OTHER): Admitting: Family Medicine

## 2024-03-16 VITALS — BP 121/71 | HR 77 | Temp 98.3°F | Ht 67.0 in | Wt 272.0 lb

## 2024-03-16 DIAGNOSIS — I10 Essential (primary) hypertension: Secondary | ICD-10-CM | POA: Diagnosis not present

## 2024-03-16 DIAGNOSIS — Z6841 Body Mass Index (BMI) 40.0 and over, adult: Secondary | ICD-10-CM | POA: Diagnosis not present

## 2024-03-16 NOTE — Progress Notes (Signed)
 SUBJECTIVE:  Chief Complaint: Obesity  Interim History: Patient has been dealing with quite a bit of familial demands with helping her mother move and helping get her mother settled.  She is also still raising kids of her own. Anticipating the next month to have most of the same demands.  She is wanting to work on boundaries.  Jodi Bailey is here to discuss her progress with her obesity treatment plan. She is on the Category 3 Plan and states she is following her eating plan approximately 50 % of the time. She states she is working in the yard.   OBJECTIVE: Visit Diagnoses: Problem List Items Addressed This Visit       Cardiovascular and Mediastinum   Essential hypertension - Primary   Blood pressure well controlled in last three appointments.  No chest pain, chest pressure or headache.  On losartan  50mg  prescribed by PCP.  No change in medication or dosage at this time.        Other   Morbid obesity (HCC)   Anthropometric Measurements Height: 5\' 7"  (1.702 m) Weight: 272 lb (123.4 kg) BMI (Calculated): 42.59 Weight at Last Visit: 271 lb Weight Lost Since Last Visit: 0 Weight Gained Since Last Visit: 1 Starting Weight: 276 lb Total Weight Loss (lbs): 4 lb (1.814 kg) Peak Weight: 325 lb Other Clinical Data Fasting: no Labs: no Today's Visit #: 4 Starting Date: 02/04/24 Comments: Cat 3       Other Visit Diagnoses       BMI 40.0-44.9, adult (HCC)           No data recorded      03/16/2024    8:00 AM 03/04/2024    8:00 AM 02/18/2024    2:00 PM  Vitals with BMI  Height 5\' 7"  5\' 7"  5\' 7"   Weight 272 lbs 271 lbs 273 lbs  BMI 42.59 42.43 42.75  Systolic 121 118 469  Diastolic 71 78 78  Pulse 77 75 75       ASSESSMENT AND PLAN:  Diet: [name] is currently in the action stage of change. As such, her goal is to continue with weight loss efforts and has agreed to the Category 3 Plan or 1450-1550 calories and 95 or more grams of protein daily.  Patient to start  food log or journaling meal plan.  The initial goal will be to habitually log or journal for at least 4 days a week.  The expectation it that patient may not initially meet calorie or protein goals as the nturitional understanding of food intake is begun.  We discussed the 10:1 ratio when reading a food label.  Patient agrees to keep a food log either electronically or on paper and bring to the next appointment to be able to dissect and discuss it with provider.    Exercise:  All adults should avoid inactivity. Some activity is better than none, and adults who participate in any amount of physical activity, gain some health benefits.  Behavior Modification:  We discussed the following Behavioral Modification Strategies today: increasing lean protein intake, decreasing simple carbohydrates, meal planning and cooking strategies, keeping healthy foods in the home, and planning for success.   Return in about 4 weeks (around 04/13/2024).Aaron Aas She was informed of the importance of frequent follow up visits to maximize her success with intensive lifestyle modifications for her multiple health conditions.  Attestation Statements:   Reviewed by clinician on day of visit: allergies, medications, problem list, medical history, surgical history, family history,  social history, and previous encounter notes.   Donaciano Frizzle, MD

## 2024-03-24 NOTE — Assessment & Plan Note (Signed)
 Anthropometric Measurements Height: 5\' 7"  (1.702 m) Weight: 272 lb (123.4 kg) BMI (Calculated): 42.59 Weight at Last Visit: 271 lb Weight Lost Since Last Visit: 0 Weight Gained Since Last Visit: 1 Starting Weight: 276 lb Total Weight Loss (lbs): 4 lb (1.814 kg) Peak Weight: 325 lb Other Clinical Data Fasting: no Labs: no Today's Visit #: 4 Starting Date: 02/04/24 Comments: Cat 3

## 2024-03-24 NOTE — Assessment & Plan Note (Signed)
 Blood pressure well controlled in last three appointments.  No chest pain, chest pressure or headache.  On losartan  50mg  prescribed by PCP.  No change in medication or dosage at this time.

## 2024-04-13 NOTE — Telephone Encounter (Signed)
 FYI

## 2024-04-20 ENCOUNTER — Ambulatory Visit (INDEPENDENT_AMBULATORY_CARE_PROVIDER_SITE_OTHER): Admitting: Family Medicine

## 2024-05-20 ENCOUNTER — Encounter: Payer: Self-pay | Admitting: Family

## 2024-07-28 ENCOUNTER — Other Ambulatory Visit: Payer: Self-pay | Admitting: Family

## 2024-07-28 NOTE — Telephone Encounter (Unsigned)
 Copied from CRM [id number]. Topic: Clinical - Medication Refill >> Jul 28, 2024  4:32 PM Dedra B wrote: Medication: losartan  (COZAAR ) 50 MG tablet  Has the patient contacted their pharmacy? Yes No pt needs a new prescription. Advised pt to make an appt since she hasn't been in this year. Pt said she will not be able to come in before her provider leaves and is still trying to decide on a new provider.  This is the patient's preferred pharmacy:  [id number] [place], [place] - [address] [place] [address] Phone: [phone] Fax: [phone]  Is this the correct pharmacy for this prescription? Yes  Has the prescription been filled recently? No  Is the patient out of the medication? Yes  Has the patient been seen for an appointment in the last year OR does the patient have an upcoming appointment? No  Can we respond through MyChart? Yes  Agent: Please be advised that Rx refills may take up to 3 business days. We ask that you follow-up with your pharmacy.

## 2024-07-29 ENCOUNTER — Encounter: Payer: Self-pay | Admitting: *Deleted

## 2024-07-29 ENCOUNTER — Other Ambulatory Visit: Payer: Self-pay | Admitting: Family

## 2024-07-29 MED ORDER — LOSARTAN POTASSIUM 50 MG PO TABS: ORAL_TABLET | ORAL | 0 refills | Status: DC

## 2024-08-24 DIAGNOSIS — L309 Dermatitis, unspecified: Secondary | ICD-10-CM | POA: Diagnosis not present

## 2024-08-24 DIAGNOSIS — L821 Other seborrheic keratosis: Secondary | ICD-10-CM | POA: Diagnosis not present

## 2024-08-24 DIAGNOSIS — L814 Other melanin hyperpigmentation: Secondary | ICD-10-CM | POA: Diagnosis not present

## 2024-08-24 DIAGNOSIS — L578 Other skin changes due to chronic exposure to nonionizing radiation: Secondary | ICD-10-CM | POA: Diagnosis not present

## 2024-08-24 DIAGNOSIS — L820 Inflamed seborrheic keratosis: Secondary | ICD-10-CM | POA: Diagnosis not present

## 2024-09-15 DIAGNOSIS — Z113 Encounter for screening for infections with a predominantly sexual mode of transmission: Secondary | ICD-10-CM | POA: Diagnosis not present

## 2024-09-15 DIAGNOSIS — Z124 Encounter for screening for malignant neoplasm of cervix: Secondary | ICD-10-CM | POA: Diagnosis not present

## 2024-09-15 DIAGNOSIS — Z01419 Encounter for gynecological examination (general) (routine) without abnormal findings: Secondary | ICD-10-CM | POA: Diagnosis not present

## 2024-11-15 ENCOUNTER — Other Ambulatory Visit: Payer: Self-pay | Admitting: Obstetrics

## 2024-11-15 ENCOUNTER — Telehealth: Payer: Self-pay | Admitting: *Deleted

## 2024-11-15 DIAGNOSIS — N63 Unspecified lump in unspecified breast: Secondary | ICD-10-CM

## 2024-11-15 NOTE — Telephone Encounter (Signed)
 Copied from CRM #[id number]. Topic: Clinical - Medication Question >> Nov 15, 2024  3:43 PM Jasmin G wrote: Reason for CRM: Pt scheduled a new pt visit with Ms. Billy on Jan 27th and states that she will run out of losartan  (COZAAR ) 50 MG tablet before that time. Pt requested a call back at [phone] to discuss it if will be possible to get a refill before that.

## 2024-11-16 ENCOUNTER — Other Ambulatory Visit: Payer: Self-pay

## 2024-11-16 DIAGNOSIS — I10 Essential (primary) hypertension: Secondary | ICD-10-CM

## 2024-11-16 MED ORDER — LOSARTAN POTASSIUM 50 MG PO TABS: ORAL_TABLET | ORAL | 1 refills | Status: DC

## 2024-11-16 NOTE — Telephone Encounter (Signed)
Refilled.pt notified.

## 2024-11-30 ENCOUNTER — Other Ambulatory Visit: Payer: Self-pay | Admitting: Family Medicine

## 2024-11-30 DIAGNOSIS — I10 Essential (primary) hypertension: Secondary | ICD-10-CM

## 2024-11-30 MED ORDER — LOSARTAN POTASSIUM 50 MG PO TABS: ORAL_TABLET | ORAL | 1 refills | Status: AC

## 2024-11-30 NOTE — Telephone Encounter (Signed)
 Copied from CRM [id number]. Topic: Clinical - Medication Refill >> Nov 30, 2024  3:36 PM Suzen RAMAN wrote: Medication: losartan  (COZAAR ) 50 MG tablet (PATIENT MISPLACED ORIGINAL MEDICATION WHILE OUT OF TOWN)   Has the patient contacted their pharmacy? Yes   This is the patient's preferred pharmacy:  [id number] [place], [place] - [address] [place] [address] Phone: [phone] Fax: [phone]  Is this the correct pharmacy for this prescription? Yes If no, delete pharmacy and type the correct one.   Has the prescription been filled recently? Yes; 11/16/24  Is the patient out of the medication? Yes  Has the patient been seen for an appointment in the last year OR does the patient have an upcoming appointment? Yes  Can we respond through MyChart? Yes  Agent: Please be advised that Rx refills may take up to 3 business days. We ask that you follow-up with your pharmacy.

## 2024-11-30 NOTE — Telephone Encounter (Signed)
(  PATIENT MISPLACED ORIGINAL MEDICATION WHILE OUT OF TOWN)

## 2024-12-07 ENCOUNTER — Ambulatory Visit: Payer: Self-pay

## 2024-12-07 ENCOUNTER — Ambulatory Visit (INDEPENDENT_AMBULATORY_CARE_PROVIDER_SITE_OTHER): Admitting: Family Medicine

## 2024-12-07 ENCOUNTER — Encounter: Payer: Self-pay | Admitting: Family Medicine

## 2024-12-07 VITALS — BP 122/78 | HR 75 | Temp 98.2°F | Ht 67.0 in | Wt 285.0 lb

## 2024-12-07 DIAGNOSIS — R42 Dizziness and giddiness: Secondary | ICD-10-CM

## 2024-12-07 DIAGNOSIS — H6690 Otitis media, unspecified, unspecified ear: Secondary | ICD-10-CM | POA: Diagnosis not present

## 2024-12-07 MED ORDER — MECLIZINE HCL 12.5 MG PO TABS: 12.5000 mg | ORAL_TABLET | Freq: Three times a day (TID) | ORAL | 0 refills | Status: AC | PRN

## 2024-12-07 MED ORDER — AMOXICILLIN-POT CLAVULANATE 875-125 MG PO TABS: 1.0000 | ORAL_TABLET | Freq: Two times a day (BID) | ORAL | 0 refills | Status: AC

## 2024-12-07 NOTE — Telephone Encounter (Signed)
 FYI Only or Action Required?: Action required by provider: request for appointment.  Patient was last seen in primary care on 03/16/2024 by Berkeley Adelita PENNER, MD.  Called Nurse Triage reporting Otalgia.  Symptoms began several days ago.  Interventions attempted: Rest, hydration, or home remedies.  Symptoms are: gradually worsening. Bilateral ear pain, low grade fever. Pain 5-6/10.  Triage Disposition: See Physician Within 24 Hours  Patient/caregiver understands and will follow disposition?: Yes     Copied from CRM #[id number]. Topic: Clinical - Red Word Triage >> Dec 07, 2024  8:17 AM Montie POUR wrote: Red Word that prompted transfer to Nurse Triage:  Since Saturday, pain in both ears; more in left ear; dizzy, low grade fever yesterday; pain level at 5 or 6 and she has a high pain tolerance. Ear pain worse since Saturday. Reason for Disposition  Earache  (Exceptions: Brief ear pain of lasting less than 60 minutes, or earache occurring during air travel.)  Answer Assessment - Initial Assessment Questions 1. LOCATION: Which ear is involved?     both 2. ONSET: When did the ear pain start?      Saturday 3. SEVERITY: How bad is the pain?  (Scale 1-10; mild, moderate or severe)     5-6 4. URI SYMPTOMS: Do you have a runny nose or cough?     no 5. FEVER: Do you have a fever? If Yes, ask: What is your temperature, how was it measured, and when did it start?     Low grade 6. CAUSE: Have you been swimming recently?, How often do you use Q-TIPS?, Have you had any recent air travel or scuba diving?     no 7. OTHER SYMPTOMS: Do you have any other symptoms? (e.g., decreased hearing, dizziness, headache, stiff neck, vomiting)     dizziness 8. PREGNANCY: Is there any chance you are pregnant? When was your last menstrual period?     no  Protocols used: [place]

## 2024-12-07 NOTE — Progress Notes (Signed)
" ° °  Acute Office Visit   Subjective:  Patient ID: Jodi Bailey, female    DOB: 06-07-1966, 59 y.o.   MRN: [id number]  Chief Complaint  Patient presents with   Ear Pain    HPI Patient is present for an acute visit for left ear pain. She has appointment to establish care with this provider later this month. Pain started on Saturday. Pain has caused her to have dizziness when bending over this morning. Associated symptoms of fever. Denies any drainage from the ear, but reports significant about of cerumen build up.  ROS See HPI above      Objective:   BP 122/78   Pulse 75   Temp 98.2 F (36.8 C) (Oral)   Ht 5' 7 (1.702 m)   Wt 285 lb (129.3 kg)   LMP 02/03/2017   SpO2 95%   BMI 44.64 kg/m    Physical Exam Vitals reviewed.  Constitutional:      General: She is not in acute distress.    Appearance: Normal appearance. She is not ill-appearing, toxic-appearing or diaphoretic.  HENT:     Head: Normocephalic and atraumatic.     Right Ear: External ear normal. No drainage. There is impacted cerumen (Mild). Tympanic membrane is erythematous.  Eyes:     General:        Right eye: No discharge.        Left eye: No discharge.     Conjunctiva/sclera: Conjunctivae normal.  Cardiovascular:     Rate and Rhythm: Normal rate.  Pulmonary:     Effort: Pulmonary effort is normal. No respiratory distress.  Musculoskeletal:        General: Normal range of motion.  Skin:    General: Skin is warm and dry.  Neurological:     General: No focal deficit present.     Mental Status: She is alert and oriented to person, place, and time. Mental status is at baseline.  Psychiatric:        Mood and Affect: Mood normal.        Behavior: Behavior normal.        Thought Content: Thought content normal.        Judgment: Judgment normal.       Assessment & Plan:  Acute otitis media, unspecified otitis media type -     Amoxicillin -Pot Clavulanate; Take 1 tablet by mouth 2 (two) times daily  for 7 days.  Dispense: 14 tablet; Refill: 0  Dizziness -     Meclizine  HCl; Take 1 tablet (12.5 mg total) by mouth 3 (three) times daily as needed for up to 5 days for dizziness.  Dispense: 15 tablet; Refill: 0  -Prescribed Augmentin  875-125mg  tablet for ear infection. Recommend to take 1 tablet every 12 hours for 7 days.  -Prescribed Meclizine  12.5mg  tablet for dizziness. Recommend to take 1 tablet every 8 hours as needed for dizziness. -Follow up if not improved.   Keimon Basaldua, NP "

## 2024-12-07 NOTE — Patient Instructions (Signed)
-  It was nice to care for you.  -Prescribed Augmentin  875-125mg  tablet for ear infection. Recommend to take 1 tablet every 12 hours for 7 days.  -Prescribed Meclizine  12.5mg  tablet for dizziness. Recommend to take 1 tablet every 8 hours as needed for dizziness. -Follow up if not improved.

## 2024-12-16 ENCOUNTER — Encounter

## 2024-12-16 ENCOUNTER — Other Ambulatory Visit

## 2024-12-16 DIAGNOSIS — N63 Unspecified lump in unspecified breast: Secondary | ICD-10-CM

## 2024-12-28 ENCOUNTER — Encounter: Admitting: Family Medicine

## 2024-12-31 ENCOUNTER — Encounter: Payer: Self-pay | Admitting: Family Medicine

## 2024-12-31 ENCOUNTER — Ambulatory Visit (INDEPENDENT_AMBULATORY_CARE_PROVIDER_SITE_OTHER): Admitting: Family Medicine

## 2024-12-31 VITALS — BP 122/76 | HR 76 | Temp 98.4°F | Ht 67.0 in | Wt 286.0 lb

## 2024-12-31 DIAGNOSIS — I10 Essential (primary) hypertension: Secondary | ICD-10-CM | POA: Diagnosis not present

## 2024-12-31 DIAGNOSIS — Z1159 Encounter for screening for other viral diseases: Secondary | ICD-10-CM | POA: Diagnosis not present

## 2024-12-31 DIAGNOSIS — R5383 Other fatigue: Secondary | ICD-10-CM

## 2024-12-31 DIAGNOSIS — Z7689 Persons encountering health services in other specified circumstances: Secondary | ICD-10-CM

## 2024-12-31 DIAGNOSIS — E559 Vitamin D deficiency, unspecified: Secondary | ICD-10-CM

## 2024-12-31 DIAGNOSIS — R7303 Prediabetes: Secondary | ICD-10-CM

## 2024-12-31 NOTE — Assessment & Plan Note (Signed)
 Blood pressure is controlled. Continue to take Losartan  50mg  daily. Ordered CMP.

## 2024-12-31 NOTE — Assessment & Plan Note (Signed)
 Not taking any medications. Ordered A1c and CMP.

## 2024-12-31 NOTE — Progress Notes (Signed)
 "  New Patient Office Visit  Subjective   Patient ID: Jodi Bailey, female    DOB: Oct 15, 1966  Age: 59 y.o. MRN: [id number]  CC:  Chief Complaint  Patient presents with   Establish Care    HPI Jodi Bailey presents to establish care with new provider.  Patients previous primary care provider: [facility] at [facility] with Leita Eliza Elbe, FNP.   Specialist: [facility]-Melanie Sutherland, PA  [facility]-Dr. Doyal Gaskins   HTN: Chronic. Patient is prescribed Losartan  50mg  daily, take an additional tablet if BP is above 150/90. Maybe taking second dose a couple of days a week. Usually after caring for her mother who has dementia. She monitors her BP at home, 118-122/72-76. Elevated after caring her mother due to stress. Denies CP, SHOB, HA, dizziness, lightheadedness, and lower extremity edema.  BP Readings from Last 3 Encounters:  12/31/24 122/76  12/07/24 122/78  03/16/24 121/71    Vitamin D  deficiency: Last vitamin D  was 21.4 on 02/04/2024. Taking an OTC supplement.   Prediabetes: Last A1c  was 6.1 on 02/04/2024. No medication.    Outpatient Encounter Medications as of 12/31/2024  Medication Sig   losartan  (COZAAR ) 50 MG tablet TAKE 1 TABLET DAILY AS DIRECTED TAKE A 2ND TABLET AS DIRECTED IF BLOOD PRESSURE IS ABOVE 150/90   Multiple Vitamins-Minerals (MULTIVITAMIN PO) Take 1 tablet by mouth daily.   Vitamin D -Vitamin K (VITAMIN K2-VITAMIN D3 PO) Take 1 tablet by mouth daily at 6 (six) AM.   [DISCONTINUED] Vitamin D , Ergocalciferol , (DRISDOL ) 1.25 MG (50000 UNIT) CAPS capsule Take 1 capsule (50,000 Units total) by mouth every 7 (seven) days.   No facility-administered encounter medications on file as of 12/31/2024.    Past Medical History:  Diagnosis Date   Anxiety    Atrial fibrillation (HCC)    in her 30s   Bilateral lower extremity edema    Cancer (HCC)    As a child-tumor removed   Chronic headaches     Depression    Depression    Hypertension    Palpitations    PCOS (polycystic ovarian syndrome)    Prediabetes    SOBOE (shortness of breath on exertion)     Past Surgical History:  Procedure Laterality Date   CESAREAN SECTION  1997   2004, 2008   HAND SURGERY  1983   3 pins placed in right pinky finger, shattered knuckle playing sports   SKIN CANCER EXCISION     1971 birth mole   tumor removed     upper right hip area (as a child)   WISDOM TOOTH EXTRACTION      Family History  Problem Relation Age of Onset   Thyroid  disease Mother    Crohn's disease Mother    Diverticulitis Mother    Anxiety disorder Mother    Alzheimer's disease Mother    Cancer Father    Hyperlipidemia Father    Hypertension Father    Diabetes Father    Heart disease Father    Kidney disease Father    Obesity Father    Atrial fibrillation Father    Depression Sister        situational   Hypertension Brother    Cancer Brother        Prostate   Hypertension Brother    Depression Son    Suicidality Son    Colon cancer Maternal Uncle    Hyperlipidemia Maternal Grandmother    Hypertension Maternal  Grandmother    Osteoporosis Maternal Grandmother    Stroke Maternal Grandfather    Colon cancer Paternal Grandmother    Heart disease Paternal Grandfather    Heart attack Paternal Grandfather    Alcohol abuse Neg Hx    Stomach cancer Neg Hx    Esophageal cancer Neg Hx    Breast cancer Neg Hx     Social History   Socioeconomic History   Marital status: Divorced    Spouse name: Not on file   Number of children: 3   Years of education: 16   Highest education level: Not on file  Occupational History   Occupation: Part time employment  Tobacco Use   Smoking status: Never   Smokeless tobacco: Never  Vaping Use   Vaping status: Never Used  Substance and Sexual Activity   Alcohol use: Yes    Comment: Very RARELY   Drug use: No   Sexual activity: Not on file  Other Topics Concern   Not on  file  Social History Narrative   Fun: [place] out with family. Anything outdoors.    Denies abuse and feels safe at home.    Social Drivers of Health   Tobacco Use: Low Risk (12/31/2024)   Patient History    Smoking Tobacco Use: Never    Smokeless Tobacco Use: Never    Passive Exposure: Not on file  Financial Resource Strain: Low Risk (12/31/2024)   Overall Financial Resource Strain (CARDIA)    Difficulty of Paying Living Expenses: Not hard at all  Food Insecurity: No Food Insecurity (12/31/2024)   Epic    Worried About Programme Researcher, Broadcasting/film/video in the Last Year: Never true    Ran Out of Food in the Last Year: Never true  Transportation Needs: No Transportation Needs (12/31/2024)   Epic    Lack of Transportation (Medical): No    Lack of Transportation (Non-Medical): No  Physical Activity: Insufficiently Active (12/31/2024)   Exercise Vital Sign    Days of Exercise per Week: 3 days    Minutes of Exercise per Session: 40 min  Stress: No Stress Concern Present (12/31/2024)   [employer] of Occupational Health - Occupational Stress Questionnaire    Feeling of Stress: Only a little  Social Connections: Moderately Integrated (12/31/2024)   Social Connection and Isolation Panel    Frequency of Communication with Friends and Family: More than three times a week    Frequency of Social Gatherings with Friends and Family: Three times a week    Attends Religious Services: More than 4 times per year    Active Member of Clubs or Organizations: Yes    Attends Banker Meetings: Never    Marital Status: Never married  Intimate Partner Violence: Not At Risk (12/31/2024)   Epic    Fear of Current or Ex-Partner: No    Emotionally Abused: No    Physically Abused: No    Sexually Abused: No  Depression (PHQ2-9): Low Risk (12/31/2024)   Depression (PHQ2-9)    PHQ-2 Score: 2  Alcohol Screen: Low Risk (12/31/2024)   Alcohol Screen    Last Alcohol Screening Score (AUDIT): 0  Housing: Low Risk  (12/31/2024)   Epic    Unable to Pay for Housing in the Last Year: No    Number of Times Moved in the Last Year: 0    Homeless in the Last Year: No  Utilities: Not At Risk (12/31/2024)   Epic    Threatened with loss of utilities:  No  Health Literacy: Adequate Health Literacy (12/31/2024)   B1300 Health Literacy    Frequency of need for help with medical instructions: Never    Review of Systems  Constitutional:  Positive for malaise/fatigue.   See HPI above    Objective  BP 122/76   Pulse 76   Temp 98.4 F (36.9 C) (Oral)   Ht 5' 7 (1.702 m)   Wt 286 lb (129.7 kg)   LMP 02/03/2017   SpO2 97%   BMI 44.79 kg/m   Physical Exam Vitals reviewed.  Constitutional:      General: She is not in acute distress.    Appearance: Normal appearance. She is morbidly obese. She is not ill-appearing, toxic-appearing or diaphoretic.  HENT:     Head: Normocephalic and atraumatic.  Eyes:     General:        Right eye: No discharge.        Left eye: No discharge.     Conjunctiva/sclera: Conjunctivae normal.  Cardiovascular:     Rate and Rhythm: Normal rate and regular rhythm.     Heart sounds: Normal heart sounds. No murmur heard.    No friction rub. No gallop.  Pulmonary:     Effort: Pulmonary effort is normal. No respiratory distress.     Breath sounds: Normal breath sounds.  Musculoskeletal:        General: Normal range of motion.  Skin:    General: Skin is warm and dry.  Neurological:     General: No focal deficit present.     Mental Status: She is alert and oriented to person, place, and time. Mental status is at baseline.  Psychiatric:        Mood and Affect: Mood normal.        Behavior: Behavior normal.        Thought Content: Thought content normal.        Judgment: Judgment normal.       Assessment & Plan:  Essential hypertension Assessment & Plan: Blood pressure is controlled. Continue to take Losartan  50mg  daily. Ordered CMP.   Orders: -     Comprehensive  metabolic panel with GFR; Future  Vitamin D  deficiency Assessment & Plan: Taking an OTC supplement. Ordered Vitamin D  level.   Orders: -     VITAMIN D  25 Hydroxy (Vit-D Deficiency, Fractures); Future  Prediabetes Assessment & Plan: Not taking any medications. Ordered A1c and CMP.   Orders: -     Comprehensive metabolic panel with GFR; Future -     Hemoglobin A1c; Future  Morbid obesity (HCC) -     CBC with Differential/Platelet; Future -     Comprehensive metabolic panel with GFR; Future -     Lipid panel; Future -     TSH; Future  Need for hepatitis C screening test -     Hepatitis C antibody; Future  Need for hepatitis B screening test -     Hepatitis B surface antibody,quantitative; Future  Fatigue, unspecified type -     CBC with Differential/Platelet; Future -     Comprehensive metabolic panel with GFR; Future -     TSH; Future -     VITAMIN D  25 Hydroxy (Vit-D Deficiency, Fractures); Future -     Vitamin B12; Future  Encounter to establish care  1.Review health maintenance: -Cervical Cancer screening: Sept/October 2025  -[facility]  -Hep C screening:Order  -Hep B screening: Order antibodies -PNA vaccine: Thinks she had   -  Zoster vaccine: Thinks she had  -Covid vaccine: Declines  2.Ordered labs based on BMI-obesity, screening, complaints of fatigue, and chronic management. Office will call with lab results and will be available via MyChart. Return in about 6 months (around 06/30/2025) for physical; lab appointment-fasting .   Lynden Carrithers, NP "

## 2024-12-31 NOTE — Assessment & Plan Note (Signed)
 Taking an OTC supplement. Ordered Vitamin D  level.

## 2024-12-31 NOTE — Patient Instructions (Signed)
-  It was nice to meet you and look forward to taking care of you. -Continue to take all medications. -Ordered labs based on BMI-obesity, screening, complaints of fatigue, and chronic management. Office will call with lab results and will be available via MyChart. -Follow up in 6 months for a physical-fasting and schedule a lab appointment to be fasting.

## 2025-01-04 ENCOUNTER — Other Ambulatory Visit

## 2025-01-19 ENCOUNTER — Other Ambulatory Visit

## 2025-06-28 ENCOUNTER — Encounter: Admitting: Family Medicine
